# Patient Record
Sex: Female | Born: 1960 | Race: White | Hispanic: No | Marital: Married | Smoking: Current every day smoker
Health system: Southern US, Community
[De-identification: ages and names within clinical notes are randomized; demographics above are authoritative.]

## PROBLEM LIST (undated history)

## (undated) DIAGNOSIS — Z8041 Family history of malignant neoplasm of ovary: Secondary | ICD-10-CM

## (undated) DIAGNOSIS — E538 Deficiency of other specified B group vitamins: Secondary | ICD-10-CM

## (undated) DIAGNOSIS — Z806 Family history of leukemia: Secondary | ICD-10-CM

## (undated) DIAGNOSIS — Z800 Family history of malignant neoplasm of digestive organs: Secondary | ICD-10-CM

## (undated) DIAGNOSIS — Z801 Family history of malignant neoplasm of trachea, bronchus and lung: Secondary | ICD-10-CM

## (undated) DIAGNOSIS — I10 Essential (primary) hypertension: Secondary | ICD-10-CM

## (undated) DIAGNOSIS — G4730 Sleep apnea, unspecified: Secondary | ICD-10-CM

## (undated) DIAGNOSIS — F32A Depression, unspecified: Secondary | ICD-10-CM

## (undated) DIAGNOSIS — Z8051 Family history of malignant neoplasm of kidney: Secondary | ICD-10-CM

## (undated) DIAGNOSIS — E559 Vitamin D deficiency, unspecified: Secondary | ICD-10-CM

## (undated) DIAGNOSIS — F419 Anxiety disorder, unspecified: Secondary | ICD-10-CM

## (undated) DIAGNOSIS — F410 Panic disorder [episodic paroxysmal anxiety] without agoraphobia: Secondary | ICD-10-CM

## (undated) DIAGNOSIS — E785 Hyperlipidemia, unspecified: Secondary | ICD-10-CM

## (undated) DIAGNOSIS — G629 Polyneuropathy, unspecified: Secondary | ICD-10-CM

## (undated) DIAGNOSIS — Z808 Family history of malignant neoplasm of other organs or systems: Secondary | ICD-10-CM

## (undated) DIAGNOSIS — T7840XA Allergy, unspecified, initial encounter: Secondary | ICD-10-CM

## (undated) DIAGNOSIS — F329 Major depressive disorder, single episode, unspecified: Secondary | ICD-10-CM

## (undated) DIAGNOSIS — E119 Type 2 diabetes mellitus without complications: Secondary | ICD-10-CM

## (undated) HISTORY — DX: Essential (primary) hypertension: I10

## (undated) HISTORY — DX: Anxiety disorder, unspecified: F41.9

## (undated) HISTORY — DX: Family history of malignant neoplasm of digestive organs: Z80.0

## (undated) HISTORY — DX: Hyperlipidemia, unspecified: E78.5

## (undated) HISTORY — DX: Sleep apnea, unspecified: G47.30

## (undated) HISTORY — DX: Family history of leukemia: Z80.6

## (undated) HISTORY — DX: Family history of malignant neoplasm of ovary: Z80.41

## (undated) HISTORY — DX: Major depressive disorder, single episode, unspecified: F32.9

## (undated) HISTORY — PX: OTHER SURGICAL HISTORY: SHX169

## (undated) HISTORY — DX: Depression, unspecified: F32.A

## (undated) HISTORY — DX: Type 2 diabetes mellitus without complications: E11.9

## (undated) HISTORY — DX: Family history of malignant neoplasm of kidney: Z80.51

## (undated) HISTORY — PX: TUBAL LIGATION: SHX77

## (undated) HISTORY — DX: Family history of malignant neoplasm of trachea, bronchus and lung: Z80.1

## (undated) HISTORY — DX: Allergy, unspecified, initial encounter: T78.40XA

## (undated) HISTORY — DX: Vitamin D deficiency, unspecified: E55.9

## (undated) HISTORY — DX: Polyneuropathy, unspecified: G62.9

## (undated) HISTORY — PX: ABDOMINAL HYSTERECTOMY: SHX81

## (undated) HISTORY — DX: Panic disorder (episodic paroxysmal anxiety): F41.0

## (undated) HISTORY — DX: Family history of malignant neoplasm of other organs or systems: Z80.8

## (undated) HISTORY — DX: Deficiency of other specified B group vitamins: E53.8

---

## 2011-04-30 ENCOUNTER — Ambulatory Visit: Payer: Self-pay | Admitting: Otolaryngology

## 2011-04-30 LAB — PROTIME-INR: Prothrombin Time: 12.6 secs (ref 11.5–14.7)

## 2011-04-30 LAB — CBC
HCT: 40.7 % (ref 35.0–47.0)
HGB: 14.3 g/dL (ref 12.0–16.0)
MCV: 95 fL (ref 80–100)
Platelet: 381 10*3/uL (ref 150–440)
RBC: 4.28 10*6/uL (ref 3.80–5.20)
WBC: 11.7 10*3/uL — ABNORMAL HIGH (ref 3.6–11.0)

## 2011-04-30 LAB — APTT: Activated PTT: 28.7 secs (ref 23.6–35.9)

## 2011-04-30 LAB — PREGNANCY, URINE: Pregnancy Test, Urine: NEGATIVE m[IU]/mL

## 2011-12-19 ENCOUNTER — Ambulatory Visit: Payer: Self-pay | Admitting: Family Medicine

## 2012-01-02 ENCOUNTER — Ambulatory Visit: Payer: Self-pay | Admitting: Family Medicine

## 2012-08-05 ENCOUNTER — Ambulatory Visit: Payer: Self-pay | Admitting: Nurse Practitioner

## 2014-05-06 DIAGNOSIS — E538 Deficiency of other specified B group vitamins: Secondary | ICD-10-CM | POA: Insufficient documentation

## 2014-07-26 NOTE — Op Note (Signed)
PATIENT NAME:  Chelsea [name], Chelsea [name] MR#:  [id number] DATE OF BIRTH:  1960-04-17  DATE OF PROCEDURE:  04/30/2011  PREOPERATIVE DIAGNOSIS: Severe nasal epistaxis.    POSTOPERATIVE DIAGNOSES: Severe nasal epistaxis.  PROCEDURE: Control of nasal hemorrhage.   SURGEON: Zackery BarefootJ. Madison Howard Patton, MD   DESCRIPTION OF PROCEDURE: The patient was placed in the supine position on the operating room table. After general endotracheal anesthesia had been induced, the patient was turned 90 degrees counterclockwise from anesthesia and placed in a beach chair position, locally anesthetized with infraorbital nerve blocks, greater palatine blocks, and topical and phenylephrine lidocaine soaked Neuro patties. These were removed after the patient had been prepped and draped in the usual fashion. The 0 degree Hopkins rod was used to examine the sinonasal cavity. Essentially there was some bleeding from an excoriation on the left Little's area. With Valsalva maneuver, there was fairly diffuse moderate bleeding in Little's area. Therefore, this was cauterized with the suction cautery under magnification with the 0 degree Hopkins rod. After this, the stomach was suctioned out with an oral gastric tube. There was about 100 mL of blood in the stomach. We did another Valsalva maneuver, no further bleeding. Surgiflo one unit was placed about two-thirds of it in the left, a third of it in the right. The patient was then returned to anesthesia, allowed to emerge from anesthesia in the operating room, and taken to the recovery room in stable condition. There were no complications. Estimated blood loss 10 mL.   ____________________________ J. Gertie BaronMadison Harden Bramer, MD [email or web address] D: 04/30/2011 14:14:23 ET T: 05/01/2011 08:42:53 ET JOB#: [id number]  cc: Zackery BarefootJ. Madison Taegan Haider, MD, <Dictator> Wendee CoppJMADISON Lirio Bach MD ELECTRONICALLY SIGNED 05/17/2011 8:24

## 2014-07-26 NOTE — Consult Note (Signed)
PATIENT NAME:  Chelsea [name], Chelsea [name] MR#:  [id number] DATE OF BIRTH:  10/05/1960  DATE OF CONSULTATION:  04/30/2011  REFERRING PHYSICIAN:   CONSULTING PHYSICIAN:  Zackery BarefootJ. Madison Muhammad Vacca, MD  HISTORY OF PRESENT ILLNESS: The patient is a 54 year old white female who has had a severe nosebleed that started last night. Yesterday afternoon she could not get it controlled. She came to the Emergency Room and has been here for about four to five hours. She has had several attempts at getting the nose bleed to stop, but unfortunately she continues to "poor blood" out of the nose.   DRUG ALLERGIES: Codeine.  MEDICATIONS: Metoprolol.   PAST MEDICAL HISTORY: Hypertension.   PAST SURGICAL HISTORY: Negative   PHYSICAL EXAMINATION:   GENERAL: Obese white female with large pack in the left nostril. It is soaked with blood.   ORAL CAVITY AND OROPHARYNX: There is some blood streaking down the posterior oropharynx.   NECK: Trachea is midline.   IMPRESSION/RECOMMENDATIONS: Acute epistaxis, likely posterior arterial bleed. We will get the patient back to the Operating Room and get the bleeding stopped. If she continues to need       packing, I will do that. Otherwise we will end up most likely sending the patient home later this afternoon so long as everything is taken care of in the Operating Room and she has no further bleeding. ____________________________ Shela CommonsJ. Gertie BaronMadison Sadik Piascik, MD [email or web address] D: 04/30/2011 13:02:36 ET T: 05/01/2011 08:19:12 ET JOB#: [id number]  cc: Zackery BarefootJ. Madison Kelilah Hebard, MD, <Dictator> Glorious PeachSonya Thompson - [facility] Wendee CoppJMADISON Coulson Wehner MD ELECTRONICALLY SIGNED 05/17/2011 8:24

## 2015-03-08 DIAGNOSIS — I10 Essential (primary) hypertension: Secondary | ICD-10-CM | POA: Insufficient documentation

## 2015-03-08 DIAGNOSIS — E782 Mixed hyperlipidemia: Secondary | ICD-10-CM | POA: Insufficient documentation

## 2015-04-23 DIAGNOSIS — G4733 Obstructive sleep apnea (adult) (pediatric): Secondary | ICD-10-CM | POA: Insufficient documentation

## 2015-11-17 ENCOUNTER — Other Ambulatory Visit: Payer: Self-pay | Admitting: Student

## 2015-11-17 DIAGNOSIS — M5416 Radiculopathy, lumbar region: Secondary | ICD-10-CM

## 2015-11-17 DIAGNOSIS — M4726 Other spondylosis with radiculopathy, lumbar region: Secondary | ICD-10-CM

## 2015-11-27 ENCOUNTER — Ambulatory Visit
Admission: RE | Admit: 2015-11-27 | Discharge: 2015-11-27 | Disposition: A | Discharge status: Home / Self Care | Payer: BLUE CROSS/BLUE SHIELD | Source: Ambulatory Visit | Attending: Student | Admitting: Student

## 2015-11-27 DIAGNOSIS — M5126 Other intervertebral disc displacement, lumbar region: Secondary | ICD-10-CM | POA: Diagnosis not present

## 2015-11-27 DIAGNOSIS — M1288 Other specific arthropathies, not elsewhere classified, other specified site: Secondary | ICD-10-CM | POA: Diagnosis not present

## 2015-11-27 DIAGNOSIS — M4726 Other spondylosis with radiculopathy, lumbar region: Secondary | ICD-10-CM | POA: Insufficient documentation

## 2015-11-27 DIAGNOSIS — M5416 Radiculopathy, lumbar region: Secondary | ICD-10-CM

## 2017-02-06 ENCOUNTER — Encounter: Payer: Self-pay | Admitting: Nurse Practitioner

## 2017-02-06 ENCOUNTER — Ambulatory Visit: Payer: BLUE CROSS/BLUE SHIELD | Attending: Nurse Practitioner | Admitting: Nurse Practitioner

## 2017-02-06 DIAGNOSIS — Z789 Other specified health status: Secondary | ICD-10-CM | POA: Diagnosis not present

## 2017-02-06 DIAGNOSIS — N289 Disorder of kidney and ureter, unspecified: Secondary | ICD-10-CM | POA: Insufficient documentation

## 2017-02-06 DIAGNOSIS — F329 Major depressive disorder, single episode, unspecified: Secondary | ICD-10-CM | POA: Insufficient documentation

## 2017-02-06 DIAGNOSIS — G8929 Other chronic pain: Secondary | ICD-10-CM | POA: Diagnosis present

## 2017-02-06 DIAGNOSIS — R200 Anesthesia of skin: Secondary | ICD-10-CM | POA: Diagnosis present

## 2017-02-06 DIAGNOSIS — I129 Hypertensive chronic kidney disease with stage 1 through stage 4 chronic kidney disease, or unspecified chronic kidney disease: Secondary | ICD-10-CM | POA: Insufficient documentation

## 2017-02-06 DIAGNOSIS — Z87442 Personal history of urinary calculi: Secondary | ICD-10-CM | POA: Diagnosis not present

## 2017-02-06 DIAGNOSIS — G629 Polyneuropathy, unspecified: Secondary | ICD-10-CM | POA: Diagnosis not present

## 2017-02-06 DIAGNOSIS — Z7409 Other reduced mobility: Secondary | ICD-10-CM | POA: Diagnosis not present

## 2017-02-06 DIAGNOSIS — M5441 Lumbago with sciatica, right side: Secondary | ICD-10-CM | POA: Diagnosis not present

## 2017-02-06 DIAGNOSIS — M79605 Pain in left leg: Secondary | ICD-10-CM | POA: Diagnosis not present

## 2017-02-06 DIAGNOSIS — M5126 Other intervertebral disc displacement, lumbar region: Secondary | ICD-10-CM | POA: Diagnosis not present

## 2017-02-06 DIAGNOSIS — E559 Vitamin D deficiency, unspecified: Secondary | ICD-10-CM | POA: Insufficient documentation

## 2017-02-06 DIAGNOSIS — M5442 Lumbago with sciatica, left side: Secondary | ICD-10-CM | POA: Diagnosis not present

## 2017-02-06 DIAGNOSIS — E785 Hyperlipidemia, unspecified: Secondary | ICD-10-CM | POA: Diagnosis not present

## 2017-02-06 DIAGNOSIS — M79675 Pain in left toe(s): Secondary | ICD-10-CM

## 2017-02-06 DIAGNOSIS — Z79899 Other long term (current) drug therapy: Secondary | ICD-10-CM | POA: Insufficient documentation

## 2017-02-06 DIAGNOSIS — M79673 Pain in unspecified foot: Secondary | ICD-10-CM | POA: Diagnosis not present

## 2017-02-06 DIAGNOSIS — R636 Underweight: Secondary | ICD-10-CM | POA: Insufficient documentation

## 2017-02-06 DIAGNOSIS — M79604 Pain in right leg: Secondary | ICD-10-CM

## 2017-02-06 DIAGNOSIS — M79674 Pain in right toe(s): Secondary | ICD-10-CM

## 2017-02-06 DIAGNOSIS — M533 Sacrococcygeal disorders, not elsewhere classified: Secondary | ICD-10-CM | POA: Diagnosis not present

## 2017-02-06 DIAGNOSIS — F419 Anxiety disorder, unspecified: Secondary | ICD-10-CM | POA: Diagnosis not present

## 2017-02-06 DIAGNOSIS — N189 Chronic kidney disease, unspecified: Secondary | ICD-10-CM | POA: Diagnosis not present

## 2017-02-06 DIAGNOSIS — G4733 Obstructive sleep apnea (adult) (pediatric): Secondary | ICD-10-CM | POA: Insufficient documentation

## 2017-02-06 NOTE — Progress Notes (Signed)
Patient's Name: Chelsea [name]  MRN: [id number]  Referring Provider: Maryland Pink, MD  DOB: 1961/01/13  PCP: Maryland Pink, MD  DOS: 02/06/2017  Note by: Dionisio David NP  Service setting: Ambulatory outpatient  Specialty: Interventional Pain Management  Location: [facility] (AMB) Pain Management Facility    Patient type: New Patient    Primary Reason(s) for Visit: Initial Patient Evaluation CC: Cold Feet (bilaterally)  HPI  Chelsea [name] is a 56 y.o. year old, female patient, who comes today for an initial evaluation. She has Benign essential hypertension; Hyperlipidemia, mixed; Neuropathy, peripheral, idiopathic; OSA (obstructive sleep apnea); Vitamin B12 deficiency; Chronic pain of lower extremity; Chronic foot pain; Chronic low back pain; Other long term (current) drug therapy; Other specified health status; Other reduced mobility; and Chronic sacroiliac joint pain on their problem list.. Her primarily concern today is the Cold Feet (bilaterally)  Pain Assessment: Location: Left, Right Foot Radiating: up to calves bilaterally-  Onset: More than a month ago Duration: Chronic pain Quality: Discomfort, Sharp, Stabbing, Numbness("myfeet feel colde but on fire", hysensitivity to feet, toes numb) Severity: 3 /10 (self-reported pain score)  Note: Reported level is compatible with observation.                          Effect on ADL: walking long distances,  Timing: Constant Modifying factors: "Nothing"  Onset and Duration: Gradual and Present longer than 3 months Cause of pain: Unknown Severity: Getting worse, NAS-11 at its worse: 7/10, NAS-11 at its best: 3/10, NAS-11 now: 4/10 and NAS-11 on the average: 4/10 Timing: Morning and During activity or exercise Aggravating Factors: Prolonged standing and Walking Alleviating Factors: Resting Associated Problems: Depression, Numbness, Tingling, Pain that wakes patient up and Pain that does not allow patient to sleep Quality of Pain: Aching, Agonizing,  Annoying, Burning, Intermittent, Hot, Sharp, Shooting, Stabbing, Tender and Tingling Previous Examinations or Tests: Nerve conduction test Previous Treatments: The patient denies treatments  The patient comes into the clinics today for the first time for a chronic pain management evaluation. According to the patient her primary area of pain is in her lower extremities; feet. She admits that the right side is greater than the left. She admits that this has been going on for several years. She has numbness, tingling and burning. She states that her feet feel cold however they're on fire. She admits that her feet are very sensitive. She does get occasional sharp, shooting, stabbing pain.  She is status post EMG which she states was negative.   Her next area of pain is in her lower back. She admits that the right side is greater than the left. She denies any precipitating factors. She denies any previous surgery. She has had one epidural steroid injection by Dr. Sharlet Salina 2017. She states this was effective for approximately one week. She has had recent MRI.  Today I took the time to provide the patient with information regarding this pain practice. The patient was informed that the practice is divided into two sections: an interventional pain management section, as well as a completely separate and distinct medication management section. I explained that there are procedure days for interventional therapies, and evaluation days for follow-ups and medication management. Because of the amount of documentation required during both, they are kept separated. This means that there is the possibility that she [name] be scheduled for a procedure on one day, and medication management the next. I have also informed her that because  of staffing and facility limitations, this practice will no longer take patients for medication management only. To illustrate the reasons for this, I gave the patient the example of surgeons, and  how inappropriate it would be to refer a patient to his/her care, just to write for the post-surgical antibiotics on a surgery done by a different surgeon.   Because interventional pain management is part of the board-certified specialty for the doctors, the patient was informed that joining this practice means that they are open to any and all interventional therapies. I made it clear that this does not mean that they will be forced to have any procedures done. What this means is that I believe interventional therapies to be essential part of the diagnosis and proper management of chronic pain conditions. Therefore, patients not interested in these interventional alternatives will be better served under the care of a different practitioner.  The patient was also made aware of my Comprehensive Pain Management Safety Guidelines where by joining this practice, they limit all of their nerve blocks and joint injections to those done by our practice, for as long as we are retained to manage their care. Historic Controlled Substance Pharmacotherapy Review  PMP and historical list of controlled substances: Diazepam 5 mg, lorazepam 0.5 mg, hydrocodone/acetaminophen 5/325 mg, Highest opioid analgesic regimen found: Hydrocodone/acetaminophen 5/325 mg 2 tablets 5 times per day (fill date 04/13/2015) hydrocodone 50 mg per day Most recent opioid analgesic: None Current opioid analgesics: None Highest recorded MME/day: 50 mg/day MME/day: 0 mg/day Medications: The patient did not bring the medication(s) to the appointment, as requested in our "New Patient Package" Pharmacodynamics: Desired effects: Analgesia: The patient reports >50% benefit. Reported improvement in function: The patient reports medication allows her to accomplish basic ADLs. Clinically meaningful improvement in function (CMIF): Sustained CMIF goals met Perceived effectiveness: Described as relatively effective, allowing for increase in  activities of daily living (ADL) Undesirable effects: Side-effects or Adverse reactions: None reported Historical Monitoring: The patient  reports that she does not use drugs. List of all UDS Test(s): No results found for: MDMA, COCAINSCRNUR, PCPSCRNUR, PCPQUANT, CANNABQUANT, THCU, [place] List of all Serum Drug Screening Test(s):  No results found for: AMPHSCRSER, BARBSCRSER, BENZOSCRSER, COCAINSCRSER, PCPSCRSER, PCPQUANT, THCSCRSER, CANNABQUANT, OPIATESCRSER, OXYSCRSER, PROPOXSCRSER Historical Background Evaluation: [place] PDMP: Six (6) year initial data search conducted.             [place] Department of public safety, offender search: Editor, commissioning Information) Non-contributory Risk Assessment Profile: Aberrant behavior: None observed or detected today Risk factors for fatal opioid overdose: age 69-45 years old, caucasian and sleep apnea Fatal overdose hazard ratio (HR): Calculation deferred Non-fatal overdose hazard ratio (HR): Calculation deferred Risk of opioid abuse or dependence: 0.7-3.0% with doses ? 36 MME/day and 6.1-26% with doses ? 120 MME/day. Substance use disorder (SUD) risk level: Pending results of Medical Psychology Evaluation for SUD Opioid risk tool (ORT) (Total Score): 3  ORT Scoring interpretation table:  Score <3 = Low Risk for SUD  Score between 4-7 = Moderate Risk for SUD  Score >8 = High Risk for Opioid Abuse   PHQ-2 Depression Scale:  Total score: 0  PHQ-2 Scoring interpretation table: (Score and probability of major depressive disorder)  Score 0 = No depression  Score 1 = 15.4% Probability  Score 2 = 21.1% Probability  Score 3 = 38.4% Probability  Score 4 = 45.5% Probability  Score 5 = 56.4% Probability  Score 6 = 78.6% Probability   PHQ-9 Depression Scale:  Total  score: 0  PHQ-9 Scoring interpretation table:  Score 0-4 = No depression  Score 5-9 = Mild depression  Score 10-14 = Moderate depression  Score 15-19 = Moderately severe depression  Score 20-27 =  Severe depression (2.4 times higher risk of SUD and 2.89 times higher risk of overuse)   Pharmacologic Plan: Pending ordered tests and/or consults  Meds  The patient has a current medication list which includes the following prescription(s): acetaminophen, amitriptyline, amitriptyline, citalopram, citalopram, furosemide, furosemide, gabapentin, gabapentin, ibuprofen, lorazepam, losartan, naproxen sodium, omega-3 fatty acids, pregabalin, and vitamin b-12.  Current Outpatient Medications on File Prior to Visit  Medication Sig  . acetaminophen (TYLENOL) 500 MG tablet Take by mouth.  Marland Kitchen amitriptyline (ELAVIL) 10 MG tablet TAKE 1 TO 2 TABLETS BY MOUTH AT BEDTIME  . amitriptyline (ELAVIL) 10 MG tablet   . citalopram (CELEXA) 20 MG tablet Take by mouth.  . citalopram (CELEXA) 20 MG tablet Take 20 mg daily by mouth.  . furosemide (LASIX) 20 MG tablet Take by mouth.  . furosemide (LASIX) 20 MG tablet   . gabapentin (NEURONTIN) 100 MG capsule TAKE 1 CAPSULE BY MOUTH 3 TIMES A DAY . CAN TITRATE UP TO 3 CAPSULES ('300MG'$ ) 3 TIMES A DAY .  . gabapentin (NEURONTIN) 100 MG capsule   . ibuprofen (ADVIL,MOTRIN) 200 MG tablet Take by mouth.  Marland Kitchen LORazepam (ATIVAN) 0.5 MG tablet Take by mouth.  . losartan (COZAAR) 100 MG tablet Take 100 mg daily by mouth.  . naproxen sodium (ALEVE) 220 MG tablet Take by mouth.  . Omega-3 Fatty Acids (FISH OIL PO) Take by mouth.  . pregabalin (LYRICA) 50 MG capsule Take by mouth.  . vitamin B-12 (CYANOCOBALAMIN) 1000 MCG tablet Take by mouth.   No current facility-administered medications on file prior to visit.    Imaging Review   Lumbosacral Imaging: Lumbar MR wo contrast:  Results for orders placed during the hospital encounter of 11/27/15  MR Lumbar Spine Wo Contrast   Narrative CLINICAL DATA:  No known injury. Low back pain. Bilateral leg pain.  EXAM: MRI LUMBAR SPINE WITHOUT CONTRAST  TECHNIQUE: Multiplanar, multisequence MR imaging of the lumbar spine  was performed. No intravenous contrast was administered.  COMPARISON:  None.  FINDINGS: Segmentation:  Standard.  Alignment:  Physiologic.  Vertebrae:  No fracture, evidence of discitis, or bone lesion.  Conus medullaris: Extends to the T12-L1 level and appears normal.  Paraspinal and other soft tissues: No paraspinal abnormality.  Disc levels:  Disc spaces: Disc spaces are maintained.  T12-L1: No significant disc bulge. No evidence of neural foraminal stenosis. No central canal stenosis.  L1-L2: No significant disc bulge. No evidence of neural foraminal stenosis. No central canal stenosis.  L2-L3: No significant disc bulge. No evidence of neural foraminal stenosis. No central canal stenosis.  L3-L4: No significant disc bulge. No evidence of neural foraminal stenosis. No central canal stenosis. Mild left facet arthropathy.  L4-L5: Shallow right foraminal disc protrusion. No central canal stenosis. Moderate bilateral facet arthropathy. Mild right foraminal narrowing. No left foraminal narrowing.  L5-S1: No significant disc protrusion. No foraminal stenosis. Moderate right and mild left facet arthropathy. No central canal stenosis.  IMPRESSION: 1. At L4-5 there is a shallow right foraminal disc protrusion. Moderate bilateral facet arthropathy. Mild right foraminal narrowing. 2. At L5-S1 there is moderate right and mild left facet arthropathy.   Electronically Signed   By: Kathreen Devoid   On: 11/27/2015 13:43    Note: Available results from prior imaging  studies were reviewed.        ROS  Cardiovascular History: High blood pressure Pulmonary or Respiratory History: No reported pulmonary signs or symptoms such as wheezing and difficulty taking a deep full breath (Asthma), difficulty blowing air out (Emphysema), coughing up mucus (Bronchitis), persistent dry cough, or temporary stoppage of breathing during sleep Neurological History: Abnormal skin sensations  (Peripheral Neuropathy) Review of Past Neurological Studies: No results found for this or any previous visit. Psychological-Psychiatric History: Anxiousness and Depressed Gastrointestinal History: No reported gastrointestinal signs or symptoms such as vomiting or evacuating blood, reflux, heartburn, alternating episodes of diarrhea and constipation, inflamed or scarred liver, or pancreas or irrregular and/or infrequent bowel movements Genitourinary History: No reported renal or genitourinary signs or symptoms such as difficulty voiding or producing urine, peeing blood, non-functioning kidney, kidney stones, difficulty emptying the bladder, difficulty controlling the flow of urine, or chronic kidney disease Hematological History: No reported hematological signs or symptoms such as prolonged bleeding, low or poor functioning platelets, bruising or bleeding easily, hereditary bleeding problems, low energy levels due to low hemoglobin or being anemic Endocrine History: No reported endocrine signs or symptoms such as high or low blood sugar, rapid heart rate due to high thyroid levels, obesity or weight gain due to slow thyroid or thyroid disease Rheumatologic History: No reported rheumatological signs and symptoms such as fatigue, joint pain, tenderness, swelling, redness, heat, stiffness, decreased range of motion, with or without associated rash Musculoskeletal History: Negative for myasthenia gravis, muscular dystrophy, multiple sclerosis or malignant hyperthermia Work History: Working full time  Allergies  Chelsea [name] is allergic to codeine.  Laboratory Chemistry  Inflammation Markers No results found for: CRP, ESRSEDRATE (CRP: Acute Phase) (ESR: Chronic Phase) Renal Function Markers No results found for: BUN, CREATININE, GFRAA, GFRNONAA Hepatic Function Markers No results found for: AST, ALT, ALBUMIN, ALKPHOS, HCVAB Electrolytes No results found for: NA, K, CL, CALCIUM, MG Neuropathy Markers No  results found for: ATFTDDUK02 Bone Pathology Markers No results found for: Hendricks Milo, VD125OH2TOT, [id number], [id number], 25OHVITD1, 25OHVITD2, 25OHVITD3, CALCIUM, TESTOFREE, TESTOSTERONE Coagulation Parameters Lab Results  Component Value Date   INR 0.9 04/30/2011   LABPROT 12.6 04/30/2011   APTT 28.7 04/30/2011   PLT 381 04/30/2011   Cardiovascular Markers Lab Results  Component Value Date   HGB 14.3 04/30/2011   HCT 40.7 04/30/2011   Note: Lab results reviewed.  PFSH  Drug: Chelsea [name]  reports that she does not use drugs. Alcohol:  reports that she drinks alcohol. Tobacco:  reports that she has been smoking cigarettes.  She has a 39.00 pack-year smoking history. She does not have any smokeless tobacco history on file. Medical:  has a past medical history of Allergy, Anxiety, Depression, Hyperlipidemia, Hypertension, Sleep apnea, Vitamin B12 deficiency, and Vitamin D deficiency. Family: family history includes Cancer in her father.  Past Surgical History:  Procedure Laterality Date  . ABDOMINAL HYSTERECTOMY    . nose    . TUBAL LIGATION     Active Ambulatory Problems    Diagnosis Date Noted  . Benign essential hypertension 03/08/2015  . Hyperlipidemia, mixed 03/08/2015  . Neuropathy, peripheral, idiopathic 05/06/2016  . OSA (obstructive sleep apnea) 04/23/2015  . Vitamin B12 deficiency 05/06/2014  . Chronic pain of lower extremity 02/06/2017  . Chronic foot pain 02/06/2017  . Chronic low back pain 02/06/2017  . Other long term (current) drug therapy 02/06/2017  . Other specified health status 02/06/2017  . Other reduced mobility 02/06/2017  . Chronic sacroiliac joint  pain 02/06/2017   Resolved Ambulatory Problems    Diagnosis Date Noted  . No Resolved Ambulatory Problems   Past Medical History:  Diagnosis Date  . Allergy   . Anxiety   . Depression   . Hyperlipidemia   . Hypertension   . Sleep apnea   . Vitamin B12 deficiency   . Vitamin D deficiency     Constitutional Exam  General appearance: Well nourished, well developed, and well hydrated. In no apparent acute distress Vitals:   02/06/17 0812  BP: (!) 180/107  Pulse: 94  Resp: 16  Temp: 98.1 F (36.7 C)  SpO2: 100%  Weight: 205 lb (93 kg)  Height: '5\' 3"'$  (1.6 m)   BMI Assessment: Estimated body mass index is 36.31 kg/m as calculated from the following:   Height as of this encounter: '5\' 3"'$  (1.6 m).   Weight as of this encounter: 205 lb (93 kg).  BMI interpretation table: BMI level Category Range association with higher incidence of chronic pain  <18 kg/m2 Underweight   18.5-24.9 kg/m2 Ideal body weight   25-29.9 kg/m2 Overweight Increased incidence by 20%  30-34.9 kg/m2 Obese (Class I) Increased incidence by 68%  35-39.9 kg/m2 Severe obesity (Class II) Increased incidence by 136%  >40 kg/m2 Extreme obesity (Class III) Increased incidence by 254%   BMI Readings from Last 4 Encounters:  02/06/17 36.31 kg/m   Wt Readings from Last 4 Encounters:  02/06/17 205 lb (93 kg)  Psych/Mental status: Alert, oriented x 3 (person, place, & time)       Eyes: PERLA Respiratory: No evidence of acute respiratory distress  Cervical Spine Exam  Inspection: No masses, redness, or swelling Alignment: Symmetrical Functional ROM: Unrestricted ROM      Stability: No instability detected Muscle strength & Tone: Functionally intact Sensory: Unimpaired Palpation: No palpable anomalies              Upper Extremity (UE) Exam    Side: Right upper extremity  Side: Left upper extremity  Inspection: No masses, redness, swelling, or asymmetry. No contractures  Inspection: No masses, redness, swelling, or asymmetry. No contractures  Functional ROM: Unrestricted ROM          Functional ROM: Unrestricted ROM          Muscle strength & Tone: Functionally intact  Muscle strength & Tone: Functionally intact  Sensory: Unimpaired  Sensory: Unimpaired  Palpation: No palpable anomalies               Palpation: No palpable anomalies              Specialized Test(s): Deferred         Specialized Test(s): Deferred          Thoracic Spine Exam  Inspection: No masses, redness, or swelling Alignment: Symmetrical Functional ROM: Unrestricted ROM Stability: No instability detected Sensory: Unimpaired Muscle strength & Tone: No palpable anomalies  Lumbar Spine Exam  Inspection: No masses, redness, or swelling Alignment: Symmetrical Functional ROM: Pain restricted ROM      Stability: No instability detected Muscle strength & Tone: Functionally intact Sensory: Unimpaired Palpation: Complains of area being tender to palpation       Provocative Tests: Lumbar Hyperextension and rotation test: Positive bilaterally for facet joint pain. Patrick's Maneuver: Positive for left-sided S-I arthralgia              Gait & Posture Assessment  Ambulation: Unassisted Gait: Antalgic Posture: WNL   Lower Extremity Exam    Side:  Right lower extremity  Side: Left lower extremity  Inspection: Some redness observed, increased temperature 2+ pulses  Inspection: Some redness observed, increased temperature 2+ pulses  Functional ROM: Unrestricted ROM          Functional ROM: Unrestricted ROM          Muscle strength & Tone: Functionally intact  Muscle strength & Tone: Functionally intact  Sensory: Unimpaired  Sensory: Unimpaired  Palpation: Tender  Palpation: Tender   Assessment  Primary Diagnosis & Pertinent Problem List: Diagnoses of Chronic foot pain, unspecified laterality, Chronic pain of both lower extremities, Chronic bilateral low back pain with bilateral sciatica, Chronic sacroiliac joint pain, Other long term (current) drug therapy, Other specified health status, and Other reduced mobility were pertinent to this visit.  Visit Diagnosis: 1. Chronic foot pain, unspecified laterality   2. Chronic pain of both lower extremities   3. Chronic bilateral low back pain with bilateral sciatica   4.  Chronic sacroiliac joint pain   5. Other long term (current) drug therapy   6. Other specified health status   7. Other reduced mobility    Plan of Care  Initial treatment plan:  Please be advised that as per protocol, today's visit has been an evaluation only. We have not taken over the patient's controlled substance management.  Problem-specific plan: No problem-specific Assessment & Plan notes found for this encounter.  Ordered Lab-work, Procedure(s), Referral(s), & Consult(s): Orders Placed This Encounter  Procedures  . DG Si Joints  . Compliance Drug Analysis, Ur  . Comp. Metabolic Panel (12)  . C-reactive protein  . Sedimentation rate  . Magnesium  . 25-Hydroxyvitamin D Lcms D2+D3  . Vitamin B12   Pharmacotherapy: Medications ordered:  No orders of the defined types were placed in this encounter.  Medications administered during this visit: Chelsea [name] had no medications administered during this visit.   Pharmacotherapy under consideration:  Opioid Analgesics: The patient was informed that there is no guarantee that she would be a candidate for opioid analgesics. The decision will be made following CDC guidelines. This decision will be based on the results of diagnostic studies, as well as Chelsea [name]'s risk profile.  Membrane stabilizer: To be determined at a later time Muscle relaxant: To be determined at a later time NSAID: To be determined at a later time Other analgesic(s): To be determined at a later time   Interventional therapies under consideration: Chelsea [name] was informed that there is no guarantee that she would be a candidate for interventional therapies. The decision will be based on the results of diagnostic studies, as well as Chelsea [name]'s risk profile.  Possible procedure(s): Lidocaine infusion Possible spinal cord stimulator Diagnostic bilateral LESI Diagnostic bilateral lumbar facet nerve block Possible bilateral lumbar facet RFA    Provider-requested  follow-up: Return for w/ Dr. Dossie Arbour, 2nd Visit.  Future Appointments  Date Time Provider M[place]  02/19/2017  8:30 AM Milinda Pointer, MD [facility] None    Primary Care Physician: Maryland Pink, MD Location: [facility] Outpatient Pain Management Facility Note by:  Date: 02/06/2017; Time: 10:20 AM  Pain Score Disclaimer: We use the NRS-11 scale. This is a self-reported, subjective measurement of pain severity with only modest accuracy. It is used primarily to identify changes within a particular patient. It must be understood that outpatient pain scales are significantly less accurate that those used for research, where they can be applied under ideal controlled circumstances with minimal exposure to variables. In reality, the score is  likely to be a combination of pain intensity and pain affect, where pain affect describes the degree of emotional arousal or changes in action readiness caused by the sensory experience of pain. Factors such as social and work situation, setting, emotional state, anxiety levels, expectation, and prior pain experience [place] influence pain perception and show large inter-individual differences that [place] also be affected by time variables.  Patient instructions provided during this appointment: Patient Instructions    ____________________________________________________________________________________________  Appointment Policy Summary  It is our goal and responsibility to provide the medical community with assistance in the evaluation and management of patients with chronic pain. Unfortunately our resources are limited. Because we do not have an unlimited amount of time, or available appointments, we are required to closely monitor and manage their use. The following rules exist to maximize their use:  Patient's responsibilities: 1. Punctuality:  At what time should I arrive? You should be physically present in our office 30 minutes before your scheduled  appointment. Your scheduled appointment is with your assigned healthcare provider. However, it takes 5-10 minutes to be "checked-in", and another 15 minutes for the nurses to do the admission. If you arrive to our office at the time you were given for your appointment, you will end up being at least 20-25 minutes late to your appointment with the provider. 2. Tardiness:  What happens if I arrive only a few minutes after my scheduled appointment time? You will need to reschedule your appointment. The cutoff is your appointment time. This is why it is so important that you arrive at least 30 minutes before that appointment. If you have an appointment scheduled for 10:00 AM and you arrive at 10:01, you will be required to reschedule your appointment.  3. Plan ahead:  Always assume that you will encounter traffic on your way in. Plan for it. If you are dependent on a driver, make sure they understand these rules and the need to arrive early. 4. Other appointments and responsibilities:  Avoid scheduling any other appointments before or after your pain clinic appointments.  5. Be prepared:  Write down everything that you need to discuss with your healthcare provider and give this information to the admitting nurse. Write down the medications that you will need refilled. Bring your pills and bottles (even the empty ones), to all of your appointments, except for those where a procedure is scheduled. 6. No children or pets:  Find someone to take care of them. It is not appropriate to bring them in. 7. Scheduling changes:  We request "advanced notification" of any changes or cancellations. 8. Advanced notification:  Defined as a time period of more than 24 hours prior to the originally scheduled appointment. This allows for the appointment to be offered to other patients. 9. Rescheduling:  When a visit is rescheduled, it will require the cancellation of the original appointment. For this reason they both fall  within the category of "Cancellations".  10. Cancellations:  They require advanced notification. Any cancellation less than 24 hours before the  appointment will be recorded as a "No Show". 11. No Show:  Defined as an unkept appointment where the patient failed to notify or declare to the practice their intention or inability to keep the appointment.  Corrective process for repeat offenders:  1. Tardiness: Three (3) episodes of rescheduling due to late arrivals will be recorded as one (1) "No Show". 2. Cancellation or reschedule: Three (3) cancellations or rescheduling will be recorded as one (1) "No Show".  3. "No Shows": Three (3) "No Shows" within a 12 month period will result in discharge from the practice.  ____________________________________________________________________________________________  ____________________________________________________________________________________________  Pain Scale  Introduction: The pain score used by this practice is the Verbal Numerical Rating Scale (VNRS-11). This is an 11-point scale. It is for adults and children 10 years or older. There are significant differences in how the pain score is reported, used, and applied. Forget everything you learned in the past and learn this scoring system.  General Information: The scale should reflect your current level of pain. Unless you are specifically asked for the level of your worst pain, or your average pain. If you are asked for one of these two, then it should be understood that it is over the past 24 hours.  Basic Activities of Daily Living (ADL): Personal hygiene, dressing, eating, transferring, and using restroom.  Instructions: Most patients tend to report their level of pain as a combination of two factors, their physical pain and their psychosocial pain. This last one is also known as "suffering" and it is reflection of how physical pain affects you socially and psychologically. From now on,  report them separately. From this point on, when asked to report your pain level, report only your physical pain. Use the following table for reference.  Pain Clinic Pain Levels (0-5/10)  Pain Level Score  Description  No Pain 0   Mild pain 1 Nagging, annoying, but does not interfere with basic activities of daily living (ADL). Patients are able to eat, bathe, get dressed, toileting (being able to get on and off the toilet and perform personal hygiene functions), transfer (move in and out of bed or a chair without assistance), and maintain continence (able to control bladder and bowel functions). Blood pressure and heart rate are unaffected. A normal heart rate for a healthy adult ranges from 60 to 100 bpm (beats per minute).   Mild to moderate pain 2 Noticeable and distracting. Impossible to hide from other people. More frequent flare-ups. Still possible to adapt and function close to normal. It can be very annoying and [place] have occasional stronger flare-ups. With discipline, patients [place] get used to it and adapt.   Moderate pain 3 Interferes significantly with activities of daily living (ADL). It becomes difficult to feed, bathe, get dressed, get on and off the toilet or to perform personal hygiene functions. Difficult to get in and out of bed or a chair without assistance. Very distracting. With effort, it can be ignored when deeply involved in activities.   Moderately severe pain 4 Impossible to ignore for more than a few minutes. With effort, patients [place] still be able to manage work or participate in some social activities. Very difficult to concentrate. Signs of autonomic nervous system discharge are evident: dilated pupils (mydriasis); mild sweating (diaphoresis); sleep interference. Heart rate becomes elevated (>115 bpm). Diastolic blood pressure (lower number) rises above 100 mmHg. Patients find relief in laying down and not moving.   Severe pain 5 Intense and extremely unpleasant. Associated  with frowning face and frequent crying. Pain overwhelms the senses.  Ability to do any activity or maintain social relationships becomes significantly limited. Conversation becomes difficult. Pacing back and forth is common, as getting into a comfortable position is nearly impossible. Pain wakes you up from deep sleep. Physical signs will be obvious: pupillary dilation; increased sweating; goosebumps; brisk reflexes; cold, clammy hands and feet; nausea, vomiting or dry heaves; loss of appetite; significant sleep disturbance with inability to fall asleep  or to remain asleep. When persistent, significant weight loss is observed due to the complete loss of appetite and sleep deprivation.  Blood pressure and heart rate becomes significantly elevated. Caution: If elevated blood pressure triggers a pounding headache associated with blurred vision, then the patient should immediately seek attention at an urgent or emergency care unit, as these [place] be signs of an impending stroke.    Emergency Department Pain Levels (6-10/10)  Emergency Room Pain 6 Severely limiting. Requires emergency care and should not be seen or managed at an outpatient pain management facility. Communication becomes difficult and requires great effort. Assistance to reach the emergency department [place] be required. Facial flushing and profuse sweating along with potentially dangerous increases in heart rate and blood pressure will be evident.   Distressing pain 7 Self-care is very difficult. Assistance is required to transport, or use restroom. Assistance to reach the emergency department will be required. Tasks requiring coordination, such as bathing and getting dressed become very difficult.   Disabling pain 8 Self-care is no longer possible. At this level, pain is disabling. The individual is unable to do even the most "basic" activities such as walking, eating, bathing, dressing, transferring to a bed, or toileting. Fine motor skills are  lost. It is difficult to think clearly.   Incapacitating pain 9 Pain becomes incapacitating. Thought processing is no longer possible. Difficult to remember your own name. Control of movement and coordination are lost.   The worst pain imaginable 10 At this level, most patients pass out from pain. When this level is reached, collapse of the autonomic nervous system occurs, leading to a sudden drop in blood pressure and heart rate. This in turn results in a temporary and dramatic drop in blood flow to the brain, leading to a loss of consciousness. Fainting is one of the body's self defense mechanisms. Passing out puts the brain in a calmed state and causes it to shut down for a while, in order to begin the healing process.    Summary: 1. Refer to this scale when providing [place] with your pain level. 2. Be accurate and careful when reporting your pain level. This will help with your care. 3. Over-reporting your pain level will lead to loss of credibility. 4. Even a level of 1/10 means that there is pain and will be treated at our facility. 5. High, inaccurate reporting will be documented as "Symptom Exaggeration", leading to loss of credibility and suspicions of possible secondary gains such as obtaining more narcotics, or wanting to appear disabled, for fraudulent reasons. 6. Only pain levels of 5 or below will be seen at our facility. 7. Pain levels of 6 and above will be sent to the Emergency Department and the appointment cancelled. ____________________________________________________________________________________________   BMI Assessment: Estimated body mass index is 36.31 kg/m as calculated from the following:   Height as of this encounter: '5\' 3"'$  (1.6 m).   Weight as of this encounter: 205 lb (93 kg).  BMI interpretation table: BMI level Category Range association with higher incidence of chronic pain  <18 kg/m2 Underweight   18.5-24.9 kg/m2 Ideal body weight   25-29.9 kg/m2 Overweight  Increased incidence by 20%  30-34.9 kg/m2 Obese (Class I) Increased incidence by 68%  35-39.9 kg/m2 Severe obesity (Class II) Increased incidence by 136%  >40 kg/m2 Extreme obesity (Class III) Increased incidence by 254%   BMI Readings from Last 4 Encounters:  02/06/17 36.31 kg/m   Wt Readings from Last 4 Encounters:  02/06/17 205  lb (93 kg)

## 2017-02-06 NOTE — Patient Instructions (Addendum)
____________________________________________________________________________________________  Appointment Policy Summary  It is our goal and responsibility to provide the medical community with assistance in the evaluation and management of patients with chronic pain. Unfortunately our resources are limited. Because we do not have an unlimited amount of time, or available appointments, we are required to closely monitor and manage their use. The following rules exist to maximize their use:  Patient's responsibilities: 1. Punctuality:  At what time should I arrive? You should be physically present in our office 30 minutes before your scheduled appointment. Your scheduled appointment is with your assigned healthcare provider. However, it takes 5-10 minutes to be "checked-in", and another 15 minutes for the nurses to do the admission. If you arrive to our office at the time you were given for your appointment, you will end up being at least 20-25 minutes late to your appointment with the provider. 2. Tardiness:  What happens if I arrive only a few minutes after my scheduled appointment time? You will need to reschedule your appointment. The cutoff is your appointment time. This is why it is so important that you arrive at least 30 minutes before that appointment. If you have an appointment scheduled for 10:00 AM and you arrive at 10:01, you will be required to reschedule your appointment.  3. Plan ahead:  Always assume that you will encounter traffic on your way in. Plan for it. If you are dependent on a driver, make sure they understand these rules and the need to arrive early. 4. Other appointments and responsibilities:  Avoid scheduling any other appointments before or after your pain clinic appointments.  5. Be prepared:  Write down everything that you need to discuss with your healthcare provider and give this information to the admitting nurse. Write down the medications that you will need  refilled. Bring your pills and bottles (even the empty ones), to all of your appointments, except for those where a procedure is scheduled. 6. No children or pets:  Find someone to take care of them. It is not appropriate to bring them in. 7. Scheduling changes:  We request "advanced notification" of any changes or cancellations. 8. Advanced notification:  Defined as a time period of more than 24 hours prior to the originally scheduled appointment. This allows for the appointment to be offered to other patients. 9. Rescheduling:  When a visit is rescheduled, it will require the cancellation of the original appointment. For this reason they both fall within the category of "Cancellations".  10. Cancellations:  They require advanced notification. Any cancellation less than 24 hours before the  appointment will be recorded as a "No Show". 11. No Show:  Defined as an unkept appointment where the patient failed to notify or declare to the practice their intention or inability to keep the appointment.  Corrective process for repeat offenders:  1. Tardiness: Three (3) episodes of rescheduling due to late arrivals will be recorded as one (1) "No Show". 2. Cancellation or reschedule: Three (3) cancellations or rescheduling will be recorded as one (1) "No Show". 3. "No Shows": Three (3) "No Shows" within a 12 month period will result in discharge from the practice.  ____________________________________________________________________________________________  ____________________________________________________________________________________________  Pain Scale  Introduction: The pain score used by this practice is the Verbal Numerical Rating Scale (VNRS-11). This is an 11-point scale. It is for adults and children 10 years or older. There are significant differences in how the pain score is reported, used, and applied. Forget everything you learned in the past and  learn this scoring  system.  General Information: The scale should reflect your current level of pain. Unless you are specifically asked for the level of your worst pain, or your average pain. If you are asked for one of these two, then it should be understood that it is over the past 24 hours.  Basic Activities of Daily Living (ADL): Personal hygiene, dressing, eating, transferring, and using restroom.  Instructions: Most patients tend to report their level of pain as a combination of two factors, their physical pain and their psychosocial pain. This last one is also known as "suffering" and it is reflection of how physical pain affects you socially and psychologically. From now on, report them separately. From this point on, when asked to report your pain level, report only your physical pain. Use the following table for reference.  Pain Clinic Pain Levels (0-5/10)  Pain Level Score  Description  No Pain 0   Mild pain 1 Nagging, annoying, but does not interfere with basic activities of daily living (ADL). Patients are able to eat, bathe, get dressed, toileting (being able to get on and off the toilet and perform personal hygiene functions), transfer (move in and out of bed or a chair without assistance), and maintain continence (able to control bladder and bowel functions). Blood pressure and heart rate are unaffected. A normal heart rate for a healthy adult ranges from 60 to 100 bpm (beats per minute).   Mild to moderate pain 2 Noticeable and distracting. Impossible to hide from other people. More frequent flare-ups. Still possible to adapt and function close to normal. It can be very annoying and [place] have occasional stronger flare-ups. With discipline, patients [place] get used to it and adapt.   Moderate pain 3 Interferes significantly with activities of daily living (ADL). It becomes difficult to feed, bathe, get dressed, get on and off the toilet or to perform personal hygiene functions. Difficult to get in and out of  bed or a chair without assistance. Very distracting. With effort, it can be ignored when deeply involved in activities.   Moderately severe pain 4 Impossible to ignore for more than a few minutes. With effort, patients [place] still be able to manage work or participate in some social activities. Very difficult to concentrate. Signs of autonomic nervous system discharge are evident: dilated pupils (mydriasis); mild sweating (diaphoresis); sleep interference. Heart rate becomes elevated (>115 bpm). Diastolic blood pressure (lower number) rises above 100 mmHg. Patients find relief in laying down and not moving.   Severe pain 5 Intense and extremely unpleasant. Associated with frowning face and frequent crying. Pain overwhelms the senses.  Ability to do any activity or maintain social relationships becomes significantly limited. Conversation becomes difficult. Pacing back and forth is common, as getting into a comfortable position is nearly impossible. Pain wakes you up from deep sleep. Physical signs will be obvious: pupillary dilation; increased sweating; goosebumps; brisk reflexes; cold, clammy hands and feet; nausea, vomiting or dry heaves; loss of appetite; significant sleep disturbance with inability to fall asleep or to remain asleep. When persistent, significant weight loss is observed due to the complete loss of appetite and sleep deprivation.  Blood pressure and heart rate becomes significantly elevated. Caution: If elevated blood pressure triggers a pounding headache associated with blurred vision, then the patient should immediately seek attention at an urgent or emergency care unit, as these [place] be signs of an impending stroke.    Emergency Department Pain Levels (6-10/10)  Emergency Room Pain 6   Severely limiting. Requires emergency care and should not be seen or managed at an outpatient pain management facility. Communication becomes difficult and requires great effort. Assistance to reach the  emergency department [place] be required. Facial flushing and profuse sweating along with potentially dangerous increases in heart rate and blood pressure will be evident.   Distressing pain 7 Self-care is very difficult. Assistance is required to transport, or use restroom. Assistance to reach the emergency department will be required. Tasks requiring coordination, such as bathing and getting dressed become very difficult.   Disabling pain 8 Self-care is no longer possible. At this level, pain is disabling. The individual is unable to do even the most "basic" activities such as walking, eating, bathing, dressing, transferring to a bed, or toileting. Fine motor skills are lost. It is difficult to think clearly.   Incapacitating pain 9 Pain becomes incapacitating. Thought processing is no longer possible. Difficult to remember your own name. Control of movement and coordination are lost.   The worst pain imaginable 10 At this level, most patients pass out from pain. When this level is reached, collapse of the autonomic nervous system occurs, leading to a sudden drop in blood pressure and heart rate. This in turn results in a temporary and dramatic drop in blood flow to the brain, leading to a loss of consciousness. Fainting is one of the body's self defense mechanisms. Passing out puts the brain in a calmed state and causes it to shut down for a while, in order to begin the healing process.    Summary: 1. Refer to this scale when providing us with your pain level. 2. Be accurate and careful when reporting your pain level. This will help with your care. 3. Over-reporting your pain level will lead to loss of credibility. 4. Even a level of 1/10 means that there is pain and will be treated at our facility. 5. High, inaccurate reporting will be documented as "Symptom Exaggeration", leading to loss of credibility and suspicions of possible secondary gains such as obtaining more narcotics, or wanting to appear  disabled, for fraudulent reasons. 6. Only pain levels of 5 or below will be seen at our facility. 7. Pain levels of 6 and above will be sent to the Emergency Department and the appointment cancelled. ____________________________________________________________________________________________   BMI Assessment: Estimated body mass index is 36.31 kg/m as calculated from the following:   Height as of this encounter: 5\' 3"  (1.6 m).   Weight as of this encounter: 205 lb (93 kg).  BMI interpretation table: BMI level Category Range association with higher incidence of chronic pain  <18 kg/m2 Underweight   18.5-24.9 kg/m2 Ideal body weight   25-29.9 kg/m2 Overweight Increased incidence by 20%  30-34.9 kg/m2 Obese (Class I) Increased incidence by 68%  35-39.9 kg/m2 Severe obesity (Class II) Increased incidence by 136%  >40 kg/m2 Extreme obesity (Class III) Increased incidence by 254%   BMI Readings from Last 4 Encounters:  02/06/17 36.31 kg/m   Wt Readings from Last 4 Encounters:  02/06/17 205 lb (93 kg)

## 2017-02-06 NOTE — Progress Notes (Signed)
Safety precautions to be maintained throughout the outpatient stay will include: orient to surroundings, keep bed in low position, maintain call bell within reach at all times, provide assistance with transfer out of bed and ambulation.  

## 2017-02-11 LAB — COMPLIANCE DRUG ANALYSIS, UR

## 2017-02-17 LAB — C-REACTIVE PROTEIN: CRP: 2.5 mg/L (ref 0.0–4.9)

## 2017-02-17 LAB — MAGNESIUM: MAGNESIUM: 1.9 mg/dL (ref 1.6–2.3)

## 2017-02-17 LAB — COMP. METABOLIC PANEL (12)
AST: 22 IU/L (ref 0–40)
Albumin/Globulin Ratio: 1.5 (ref 1.2–2.2)
Albumin: 4.3 g/dL (ref 3.5–5.5)
Alkaline Phosphatase: 80 IU/L (ref 39–117)
BILIRUBIN TOTAL: 0.3 mg/dL (ref 0.0–1.2)
BUN / CREAT RATIO: 16 (ref 9–23)
BUN: 15 mg/dL (ref 6–24)
CALCIUM: 9.7 mg/dL (ref 8.7–10.2)
CREATININE: 0.92 mg/dL (ref 0.57–1.00)
Chloride: 95 mmol/L — ABNORMAL LOW (ref 96–106)
GFR calc Af Amer: 82 mL/min/{1.73_m2} (ref >59)
GFR calc non Af Amer: 71 mL/min/{1.73_m2} (ref >59)
Globulin, Total: 2.8 g/dL (ref 1.5–4.5)
Glucose: 103 mg/dL — ABNORMAL HIGH (ref 65–99)
Potassium: 4.2 mmol/L (ref 3.5–5.2)
Sodium: 135 mmol/L (ref 134–144)
TOTAL PROTEIN: 7.1 g/dL (ref 6.0–8.5)

## 2017-02-17 LAB — 25-HYDROXYVITAMIN D LCMS D2+D3
25-HYDROXY, VITAMIN D-2: 1.5 ng/mL
25-HYDROXY, VITAMIN D: 32 ng/mL

## 2017-02-17 LAB — 25-HYDROXY VITAMIN D LCMS D2+D3: 25-Hydroxy, Vitamin D-3: 30 ng/mL

## 2017-02-17 LAB — SEDIMENTATION RATE: Sed Rate: 15 mm/hr (ref 0–40)

## 2017-02-17 LAB — VITAMIN B12: Vitamin B-12: 1523 pg/mL — ABNORMAL HIGH (ref 232–1245)

## 2017-02-19 ENCOUNTER — Ambulatory Visit: Payer: BLUE CROSS/BLUE SHIELD | Admitting: Pain Medicine

## 2017-02-25 NOTE — Progress Notes (Addendum)
Patient's Name: Chelsea [name]  MRN: [id number]  Referring Provider: Maryland Pink, MD  DOB: 03/02/1961  PCP: Maryland Pink, MD  DOS: 02/26/2017  Note by: Gaspar Cola, MD  Service setting: Ambulatory outpatient  Specialty: Interventional Pain Management  Location: [facility] (AMB) Pain Management Facility    Patient type: Established   Primary Reason(s) for Visit: Encounter for evaluation before starting new chronic pain management plan of care (Level of risk: moderate) CC: Foot Pain (bilateral)  HPI  Chelsea [name] is a 56 y.o. year old, female patient, who comes today for a follow-up evaluation to review the test results and decide on a treatment plan. 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 Her primarily concern today is the Foot Pain (bilateral)  Pain Assessment: Location: Left, Right Foot Radiating: up to the calves Onset: More than a month ago Duration: Chronic pain Quality: Aching,  Burning, Sharp, Stabbing, Numbness Severity: 5 /10 (self-reported pain score)  Note: Reported level is inconsistent with clinical observations. Clinically the patient looks like a 2/10 A 2/10 is viewed as "Mild to Moderate" and described as noticeable and distracting. Impossible to hide from other people. More frequent flare-ups. Still possible to adapt and function close to normal. It can be very annoying and Spicer have occasional stronger flare-ups. With discipline, patients Mobley get used to it and adapt. Information on the proper use of the pain scale provided to the patient today. When using our objective Pain Scale, levels between 6 and 10/10 are said to belong in an emergency room, as it progressively worsens from a 6/10, described as severely limiting, requiring emergency care not usually available at an outpatient pain management facility. At a 6/10 level, communication becomes difficult and requires great effort. Assistance to reach the emergency department [place] be required. Facial flushing and profuse sweating along with potentially dangerous increases in heart rate and blood pressure will be evident. Effect on ADL: hard to walk. Timing: Constant Modifying factors: none.  Chelsea [name] comes in today for a follow-up visit after her initial evaluation on 02/06/2017. Today we went over the results of her tests. These were explained in "Layman's terms". During today's appointment we went over my diagnostic impression, as well as the proposed treatment plan.  According to the patient her primary area of pain is in her lower extremities; feet. She admits that the right side is greater than the left. She admits that this has been going on for several years. She has numbness, tingling and burning. She states that her feet feel cold however they're on fire. She admits that her feet are very sensitive. She does get occasional sharp, shooting, stabbing pain.  She is status post EMG which she states was negative.    Her next area of pain is in  her lower back. She admits that the right side is greater than the left. She denies any precipitating factors. She denies any previous surgery. She has had one epidural steroid injection by Dr. Sharlet Salina (12/21/2015 8:45 AM) (Right S1 transforaminal epidural injection under fluoroscopic guidance). She states this was effective for approximately one week. She has had recent MRI.  The patient has already seen the neurosurgeon at the [facility].  In considering the treatment plan options, Ms. Hennes was reminded that I no longer take patients for medication management only. I asked her to let me know if she had no intention of taking advantage of the interventional therapies, so that we could make arrangements to provide this space to someone interested. I also made it clear that undergoing interventional therapies for the purpose of getting pain medications is very inappropriate on the part of a patient, and it will not be tolerated in this practice. This type of behavior would suggest true addiction and therefore it requires referral to an addiction specialist.   Further details on both, my assessment(s), as well as the proposed treatment plan, please see below.  Controlled Substance Pharmacotherapy Assessment REMS (Risk Evaluation and Mitigation Strategy)  Analgesic: None Highest recorded MME/day: 50 mg/day MME/day: 0 mg/day Pill Count: None expected due to no prior prescriptions written by our practice. Charna Busman, NT  02/26/2017 10:29 AM  Signed Safety precautions to be maintained throughout the outpatient stay will include: orient to surroundings, keep bed in low position, maintain call bell within reach at all times, provide assistance with transfer out of bed and ambulation.    Pharmacokinetics: Liberation and absorption (onset of action): WNL Distribution (time to peak effect): WNL Metabolism and excretion (duration of action): WNL          Pharmacodynamics: Desired effects: Analgesia: Ms. Siems reports >50% benefit. Functional ability: Patient reports that medication allows her to accomplish basic ADLs Clinically meaningful improvement in function (CMIF): Sustained CMIF goals met Perceived effectiveness: Described as relatively effective, allowing for increase in activities of daily living (ADL) Undesirable effects: Side-effects or Adverse reactions: None reported Monitoring: [place] PMP: Online review of the past 41-monthperiod previously conducted. Not applicable at this point since we have not taken over the patient's medication management yet. List of other Serum/Urine Drug Screening Test(s):  No results found for: AMPHSCRSER, BARBSCRSER, BENZOSCRSER, COCAINSCRSER, COCAINSCRNUR, PCPSCRSER, THCSCRSER, THCU, CANNABQUANT, [place] of all UDS test(s) done:  Lab Results  Component Value Date   SUMMARY FINAL 02/06/2017   Last UDS on record: Summary  Date Value Ref Range Status  02/06/2017 FINAL  Final    Comment:    ==================================================================== TOXASSURE COMP DRUG ANALYSIS,UR ==================================================================== Test                             Result       Flag       Units Drug Present and Declared for Prescription Verification   Gabapentin                     PRESENT      EXPECTED   Amitriptyline                  PRESENT      EXPECTED   Nortriptyline                  PRESENT      EXPECTED  Nortriptyline is an expected metabolite of amitriptyline.   Citalopram                     PRESENT      EXPECTED   Desmethylcitalopram            PRESENT      EXPECTED    Desmethylcitalopram is an expected metabolite of citalopram or    the enantiomeric form, escitalopram. Drug Absent but Declared for Prescription Verification   Lorazepam                      Not Detected UNEXPECTED ng/mg creat   Pregabalin                      Not Detected UNEXPECTED   Acetaminophen                  Not Detected UNEXPECTED    Acetaminophen, as indicated in the declared medication list, is    not always detected even when used as directed.   Ibuprofen                      Not Detected UNEXPECTED    Ibuprofen, as indicated in the declared medication list, is not    always detected even when used as directed.   Naproxen                       Not Detected UNEXPECTED ==================================================================== Test                      Result    Flag   Units      Ref Range   Creatinine              68               mg/dL      >=20 ==================================================================== Declared Medications:  The flagging and interpretation on this report are based on the  following declared medications.  Unexpected results Neale arise from  inaccuracies in the declared medications.  **Note: The testing scope of this panel includes these medications:  Amitriptyline  Citalopram  Gabapentin  Lorazepam  Naproxen  Pregabalin  **Note: The testing scope of this panel does not include small to  moderate amounts of these reported medications:  Acetaminophen  Ibuprofen  **Note: The testing scope of this panel does not include following  reported medications:  Cyanocobalamin  Furosemide  Losartan (Losartan Potassium)  Omega-3 Fatty Acids ==================================================================== For clinical consultation, please call [phone]. ====================================================================    UDS interpretation: No unexpected findings. Patient informed of the CDC guidelines and recommendations to stay away from the concomitant use of benzodiazepines and opioids due to the increased risk of respiratory depression and death. Medication Assessment Form: Not applicable. Treatment compliance: Not applicable Risk Assessment Profile: Aberrant behavior: See  initial evaluations. None observed or detected today Comorbid factors increasing risk of overdose: See initial evaluation. No additional risks detected today Medical Psychology Evaluation: Please see scanned results in medical record. Opioid Risk Tool - 02/06/17 0833      Family History of Substance Abuse   Alcohol  Negative    Illegal Drugs  Negative    Rx Drugs  Negative      Personal History of Substance Abuse   Alcohol  Negative    Illegal Drugs  Negative    Rx Drugs  Negative      Psychological Disease   Psychological Disease  Positive    ADD  Negative    OCD  Negative    Bipolar  Negative    Schizophrenia  Negative    Depression  Positive      Total Score   Opioid Risk Tool Scoring  3    Opioid Risk Interpretation  Low Risk      ORT Scoring interpretation table:  Score <3 = Low Risk for SUD  Score between 4-7 = Moderate Risk for SUD  Score >8 = High Risk for Opioid Abuse   Risk Mitigation Strategies:  Patient opioid safety counseling:  Not applicable. Patient-Prescriber Agreement (PPA): No agreement signed.  Controlled substance notification to other providers: Not applicable  Pharmacologic Plan: The patient has expressed her desire to avoid opioids as much as possible             Laboratory Chemistry  Inflammation Markers (CRP: Acute Phase) (ESR: Chronic Phase) Lab Results  Component Value Date   CRP 2.5 02/06/2017   ESRSEDRATE 15 02/06/2017                 Renal Function Markers Lab Results  Component Value Date   BUN 15 02/06/2017   CREATININE 0.92 02/06/2017   GFRAA 82 02/06/2017   GFRNONAA 71 02/06/2017                 Hepatic Function Markers Lab Results  Component Value Date   AST 22 02/06/2017   ALBUMIN 4.3 02/06/2017   ALKPHOS 80 02/06/2017                 Electrolytes Lab Results  Component Value Date   NA 135 02/06/2017   K 4.2 02/06/2017   CL 95 (L) 02/06/2017   CALCIUM 9.7 02/06/2017   MG 1.9 02/06/2017                  Neuropathy Markers Lab Results  Component Value Date   VITAMINB12 1,523 (H) 02/06/2017                 Bone Pathology Markers Lab Results  Component Value Date   ALKPHOS 80 02/06/2017   25OHVITD1 32 02/06/2017   25OHVITD2 1.5 02/06/2017   25OHVITD3 30 02/06/2017   CALCIUM 9.7 02/06/2017                 Rheumatology Markers No results found for: LABURIC, URICUR              Coagulation Parameters Lab Results  Component Value Date   INR 0.9 04/30/2011   LABPROT 12.6 04/30/2011   APTT 28.7 04/30/2011   PLT 381 04/30/2011                 Cardiovascular Markers Lab Results  Component Value Date   HGB 14.3 04/30/2011   HCT 40.7 04/30/2011                 CA Markers No results found for: CEA, CA125, LABCA2               Note: Lab results reviewed.  Recent Diagnostic Imaging Review  Lumbosacral Imaging: Lumbar MR wo contrast:  Results for orders placed during the hospital encounter of 11/27/15  MR Lumbar Spine Wo Contrast   Narrative CLINICAL DATA:  No known injury. Low back pain. Bilateral leg pain.  EXAM: MRI LUMBAR SPINE WITHOUT CONTRAST  TECHNIQUE: Multiplanar, multisequence MR imaging  of the lumbar spine was performed. No intravenous contrast was administered.  COMPARISON:  None.  FINDINGS: Segmentation:  Standard.  Alignment:  Physiologic.  Vertebrae:  No fracture, evidence of discitis, or bone lesion.  Conus medullaris: Extends to the T12-L1 level and appears normal.  Paraspinal and other soft tissues: No paraspinal abnormality.  Disc levels:  Disc spaces: Disc spaces are maintained.  T12-L1: No significant disc bulge. No evidence of neural foraminal stenosis. No central canal stenosis.  L1-L2: No significant disc bulge. No evidence of neural foraminal stenosis. No central canal stenosis.  L2-L3: No significant disc bulge. No evidence of neural foraminal stenosis. No central canal stenosis.  L3-L4: No significant disc bulge. No  evidence of neural foraminal stenosis. No central canal stenosis. Mild left facet arthropathy.  L4-L5: Shallow right foraminal disc protrusion. No central canal stenosis. Moderate bilateral facet arthropathy. Mild right foraminal narrowing. No left foraminal narrowing.  L5-S1: No significant disc protrusion. No foraminal stenosis. Moderate right and mild left facet arthropathy. No central canal stenosis.  IMPRESSION: 1. At L4-5 there is a shallow right foraminal disc protrusion. Moderate bilateral facet arthropathy. Mild right foraminal narrowing. 2. At L5-S1 there is moderate right and mild left facet arthropathy.   Electronically Signed   By: Kathreen Devoid   On: 11/27/2015 13:43    Complexity Note: Imaging results reviewed. Results shared with Ms. Branford, using Layman's terms.                         Meds   Current Outpatient Medications:  .  acetaminophen (TYLENOL) 500 MG tablet, Take by mouth., Disp: , Rfl:  .  amitriptyline (ELAVIL) 10 MG tablet, TAKE 1 TO 2 TABLETS BY MOUTH AT BEDTIME, Disp: , Rfl:  .  citalopram (CELEXA) 20 MG tablet, Take 20 mg daily by mouth., Disp: , Rfl: 3 .  furosemide (LASIX) 20 MG tablet, Take by mouth., Disp: , Rfl:  .  gabapentin (NEURONTIN) 100 MG capsule, TAKE 1 CAPSULE BY MOUTH 3 TIMES A DAY . CAN TITRATE UP TO 3 CAPSULES (300MG) 3 TIMES A DAY ., Disp: , Rfl:  .  ibuprofen (ADVIL,MOTRIN) 200 MG tablet, Take by mouth., Disp: , Rfl:  .  LORazepam (ATIVAN) 0.5 MG tablet, Take by mouth., Disp: , Rfl:  .  losartan (COZAAR) 100 MG tablet, Take 100 mg daily by mouth., Disp: , Rfl: 10 .  naproxen sodium (ALEVE) 220 MG tablet, Take by mouth., Disp: , Rfl:  .  Omega-3 Fatty Acids (FISH OIL PO), Take by mouth., Disp: , Rfl:  .  vitamin B-12 (CYANOCOBALAMIN) 1000 MCG tablet, Take by mouth., Disp: , Rfl:   ROS  Constitutional: Denies any fever or chills Gastrointestinal: No reported hemesis, hematochezia, vomiting, or acute GI distress Musculoskeletal:  Denies any acute onset joint swelling, redness, loss of ROM, or weakness Neurological: No reported episodes of acute onset apraxia, aphasia, dysarthria, agnosia, amnesia, paralysis, loss of coordination, or loss of consciousness  Allergies  Ms. Sampsel is allergic to codeine.  PFSH  Drug: Ms. Vanetten  reports that she does not use drugs. Alcohol:  reports that she drinks alcohol. Tobacco:  reports that she has been smoking cigarettes.  She has a 39.00 pack-year smoking history. she has never used smokeless tobacco. Medical:  has a past medical history of Allergy, Anxiety, Depression, Hyperlipidemia, Hypertension, Sleep apnea, Vitamin B12 deficiency, and Vitamin D deficiency. Surgical: Ms. Hakimi  has a past surgical history that includes Abdominal  hysterectomy; Tubal ligation; and nose. Family: family history includes Cancer in her father.  Constitutional Exam  General appearance: Well nourished, well developed, and well hydrated. In no apparent acute distress Vitals:   02/26/17 0825  BP: (!) 173/90  Pulse: 97  Resp: 16  Temp: 98.5 F (36.9 C)  TempSrc: Oral  SpO2: 99%  Weight: 205 lb (93 kg)  Height: _0  (1.6 m)   BMI Assessment: Estimated body mass index is 36.31 kg/m as calculated from the following:   Height as of this encounter: _1  (1.6 m).   Weight as of this encounter: 205 lb (93 kg).  BMI interpretation table: BMI level Category Range association with higher incidence of chronic pain  <18 kg/m2 Underweight   18.5-24.9 kg/m2 Ideal body weight   25-29.9 kg/m2 Overweight Increased incidence by 20%  30-34.9 kg/m2 Obese (Class I) Increased incidence by 68%  35-39.9 kg/m2 Severe obesity (Class II) Increased incidence by 136%  >40 kg/m2 Extreme obesity (Class III) Increased incidence by 254%   BMI Readings from Last 4 Encounters:  02/26/17 36.31 kg/m  02/06/17 36.31 kg/m   Wt Readings from Last 4 Encounters:  02/26/17 205 lb (93 kg)  02/06/17 205 lb (93 kg)  Psych/Mental  status: Alert, oriented x 3 (person, place, & time)       Eyes: PERLA Respiratory: No evidence of acute respiratory distress  Cervical Spine Area Exam  Skin & Axial Inspection: No masses, redness, edema, swelling, or associated skin lesions Alignment: Symmetrical Functional ROM: Unrestricted ROM      Stability: No instability detected Muscle Tone/Strength: Functionally intact. No obvious neuro-muscular anomalies detected. Sensory (Neurological): Unimpaired Palpation: No palpable anomalies              Upper Extremity (UE) Exam    Side: Right upper extremity  Side: Left upper extremity  Skin & Extremity Inspection: Skin color, temperature, and hair growth are WNL. No peripheral edema or cyanosis. No masses, redness, swelling, asymmetry, or associated skin lesions. No contractures.  Skin & Extremity Inspection: Skin color, temperature, and hair growth are WNL. No peripheral edema or cyanosis. No masses, redness, swelling, asymmetry, or associated skin lesions. No contractures.  Functional ROM: Unrestricted ROM          Functional ROM: Unrestricted ROM          Muscle Tone/Strength: Functionally intact. No obvious neuro-muscular anomalies detected.  Muscle Tone/Strength: Functionally intact. No obvious neuro-muscular anomalies detected.  Sensory (Neurological): Unimpaired          Sensory (Neurological): Unimpaired          Palpation: No palpable anomalies              Palpation: No palpable anomalies              Specialized Test(s): Deferred         Specialized Test(s): Deferred          Thoracic Spine Area Exam  Skin & Axial Inspection: No masses, redness, or swelling Alignment: Symmetrical Functional ROM: Unrestricted ROM Stability: No instability detected Muscle Tone/Strength: Functionally intact. No obvious neuro-muscular anomalies detected. Sensory (Neurological): Unimpaired Muscle strength & Tone: No palpable anomalies  Lumbar Spine Area Exam  Skin & Axial Inspection: No masses,  redness, or swelling Alignment: Symmetrical Functional ROM: Decreased ROM      Stability: No instability detected Muscle Tone/Strength: Functionally intact. No obvious neuro-muscular anomalies detected. Sensory (Neurological): Movement-associated pain Palpation: Complains of area being tender to palpation  Provocative Tests: Lumbar Hyperextension and rotation test: Positive bilaterally for facet joint pain. Lumbar Lateral bending test: evaluation deferred today       Patrick's Maneuver: Positive for bilateral S-I arthralgia and for bilateral hip arthralgia  Gait & Posture Assessment  Ambulation: Unassisted Gait: Antalgic Posture: Difficulty standing up straight, due to pain   Lower Extremity Exam    Side: Right lower extremity  Side: Left lower extremity  Skin & Extremity Inspection: Skin color, temperature, and hair growth are WNL. No peripheral edema or cyanosis. No masses, redness, swelling, asymmetry, or associated skin lesions. No contractures.  Skin & Extremity Inspection: Skin color, temperature, and hair growth are WNL. No peripheral edema or cyanosis. No masses, redness, swelling, asymmetry, or associated skin lesions. No contractures.  Functional ROM: Decreased ROM for hip joint  Functional ROM: Decreased ROM for hip joint  Muscle Tone/Strength: Guarding  Muscle Tone/Strength: TEFL teacher (Neurological): Arthropathic arthralgia  Sensory (Neurological): Arthropathic arthralgia  Palpation: No palpable anomalies  Palpation: No palpable anomalies   Assessment & Plan  Primary Diagnosis & Pertinent Problem List: The primary encounter diagnosis was Chronic pain syndrome. 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  Visit Diagnosis: 1. Chronic pain syndrome   2. Chronic pain of lower extremity (Primary Area of Pain) (Bilateral) (R>L)   3. Lumbar spondylosis with radiculitis (Bilateral) (L5)   4. Chronic L5 Lumbar radiculitis (Bilateral)   5. L4-5 foraminal disc protrusion (Right)   6. 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 Chronic foot pain (Bilateral) (R>L)   8. 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 Chronic low back pain (Secondary Area of Pain) (Bilateral) (R>L)   10. Chronic sacroiliac joint pain (Bilateral) (R>L)   11. Lumbar facet arthropathy (Bilateral)   12. Lumbar facet osteoarthritis   13. Lumbar facet syndrome (Bilateral) (R>L)   14. Osteoarthritis lumbar spine   15. Chronic hip pain (Bilateral) (R>L)   16. Osteoarthritis of hip (Bilateral)   17. Osteoarthritis   18. Disorder of skeletal system   19. Pharmacologic therapy   20. Problems influencing health status   21. OSA (obstructive sleep apnea)   22. Abnormal MRI, lumbar spine    Problems updated and reviewed during this visit: Problem  Abnormal MRI, lumbar spine (11/27/2015)   FINDINGS: L3-4: Left facet arthropathy. L4-5: Shallow right foraminal disc protrusion. Moderate bilateral facet arthropathy. Mild right foraminal narrowing. L5-S1: Moderate right and mild left facet arthropathy.   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 Possible small fiber disease cannot be ruled out.   Chronic L5 Lumbar radiculitis (Bilateral) (R>L)   According to the patient the pain pattern goes through the top of the foot and into the big  toe, bilaterally, with the right being worst on the left.   Lumbar facet osteoarthritis (Bilateral)   Time Note: Greater than 50% of the 40 minute(s) of face-to-face time spent with Ms. Rusnak, was spent in counseling/coordination of care regarding: Ms. Roughton primary cause of pain, the results of her recent test(s), the significance of each one oth the test(s) anomalies and it's corresponding characteristic pain pattern(s), the treatment plan, treatment alternatives, the risks and possible complications of proposed treatment, realistic expectations, the goals of pain management (increased in functionality), the need to bring and keep the BMI below 30 and the need to collect and read the AVS material.  Plan of Care  Pharmacotherapy (Medications Ordered): No orders of the defined types were placed in this encounter.   Procedure Orders     Lumbar Transforaminal Epidural Lab Orders  No laboratory test(s) ordered today   Imaging Orders  No imaging studies ordered today   Referral Orders  No referral(s) requested today    Pharmacological management options:  Opioid Analgesics: Ms. 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 Diagnostic bilateral lumbar sympathetic blocks  Possible bilateral lumbar facet RFA  Diagnostic bilateral lumbar facet nerve block  Possible bilateral lumbar facet RFA  Diagnostic bilateral sacroiliac joint block  Possible bilateral sacroiliac joint RFA  Diagnostic bilateral intra-articular hip joint injection  Diagnostic bilateral femoral nerve + obturator nerve block  Possible bilateral femoral nerve + obturator nerve RFA Possible spinal cord stimulator trial    PRN Procedures:   None at this time   Provider-requested follow-up: Return for Procedure (w/ sedation): (B) L5 TFESI.  Future Appointments  Date Time Provider [place]  03/08/2017  8:15 AM Milinda Pointer, MD [facility] None    Primary Care Physician: Maryland Pink, MD Location: [facility] Note by: Gaspar Cola, MD Date: 02/26/2017; Time: 5:31 PM

## 2017-02-26 ENCOUNTER — Ambulatory Visit: Payer: BLUE CROSS/BLUE SHIELD | Attending: Pain Medicine | Admitting: Pain Medicine

## 2017-02-26 ENCOUNTER — Encounter: Payer: Self-pay | Admitting: Pain Medicine

## 2017-02-26 VITALS — BP 173/90 | HR 97 | Temp 98.5°F | Resp 16 | Ht 63.0 in | Wt 205.0 lb

## 2017-02-26 DIAGNOSIS — Z791 Long term (current) use of non-steroidal anti-inflammatories (NSAID): Secondary | ICD-10-CM | POA: Diagnosis not present

## 2017-02-26 DIAGNOSIS — M5126 Other intervertebral disc displacement, lumbar region: Secondary | ICD-10-CM | POA: Diagnosis not present

## 2017-02-26 DIAGNOSIS — Z9889 Other specified postprocedural states: Secondary | ICD-10-CM | POA: Insufficient documentation

## 2017-02-26 DIAGNOSIS — M150 Primary generalized (osteo)arthritis: Secondary | ICD-10-CM

## 2017-02-26 DIAGNOSIS — G894 Chronic pain syndrome: Secondary | ICD-10-CM | POA: Insufficient documentation

## 2017-02-26 DIAGNOSIS — M4726 Other spondylosis with radiculopathy, lumbar region: Secondary | ICD-10-CM

## 2017-02-26 DIAGNOSIS — M25551 Pain in right hip: Secondary | ICD-10-CM | POA: Insufficient documentation

## 2017-02-26 DIAGNOSIS — Z809 Family history of malignant neoplasm, unspecified: Secondary | ICD-10-CM | POA: Insufficient documentation

## 2017-02-26 DIAGNOSIS — M5442 Lumbago with sciatica, left side: Secondary | ICD-10-CM | POA: Diagnosis not present

## 2017-02-26 DIAGNOSIS — M5116 Intervertebral disc disorders with radiculopathy, lumbar region: Secondary | ICD-10-CM | POA: Insufficient documentation

## 2017-02-26 DIAGNOSIS — M48061 Spinal stenosis, lumbar region without neurogenic claudication: Secondary | ICD-10-CM | POA: Diagnosis not present

## 2017-02-26 DIAGNOSIS — Z885 Allergy status to narcotic agent status: Secondary | ICD-10-CM | POA: Diagnosis not present

## 2017-02-26 DIAGNOSIS — G4733 Obstructive sleep apnea (adult) (pediatric): Secondary | ICD-10-CM | POA: Insufficient documentation

## 2017-02-26 DIAGNOSIS — G629 Polyneuropathy, unspecified: Secondary | ICD-10-CM | POA: Diagnosis not present

## 2017-02-26 DIAGNOSIS — F419 Anxiety disorder, unspecified: Secondary | ICD-10-CM | POA: Insufficient documentation

## 2017-02-26 DIAGNOSIS — M79674 Pain in right toe(s): Secondary | ICD-10-CM | POA: Diagnosis not present

## 2017-02-26 DIAGNOSIS — M533 Sacrococcygeal disorders, not elsewhere classified: Secondary | ICD-10-CM | POA: Insufficient documentation

## 2017-02-26 DIAGNOSIS — M9983 Other biomechanical lesions of lumbar region: Secondary | ICD-10-CM | POA: Diagnosis not present

## 2017-02-26 DIAGNOSIS — M5416 Radiculopathy, lumbar region: Secondary | ICD-10-CM | POA: Diagnosis not present

## 2017-02-26 DIAGNOSIS — F329 Major depressive disorder, single episode, unspecified: Secondary | ICD-10-CM | POA: Insufficient documentation

## 2017-02-26 DIAGNOSIS — M79604 Pain in right leg: Secondary | ICD-10-CM | POA: Diagnosis not present

## 2017-02-26 DIAGNOSIS — M47816 Spondylosis without myelopathy or radiculopathy, lumbar region: Secondary | ICD-10-CM | POA: Diagnosis not present

## 2017-02-26 DIAGNOSIS — G609 Hereditary and idiopathic neuropathy, unspecified: Secondary | ICD-10-CM

## 2017-02-26 DIAGNOSIS — M79605 Pain in left leg: Secondary | ICD-10-CM

## 2017-02-26 DIAGNOSIS — R937 Abnormal findings on diagnostic imaging of other parts of musculoskeletal system: Secondary | ICD-10-CM

## 2017-02-26 DIAGNOSIS — E559 Vitamin D deficiency, unspecified: Secondary | ICD-10-CM | POA: Insufficient documentation

## 2017-02-26 DIAGNOSIS — Z79899 Other long term (current) drug therapy: Secondary | ICD-10-CM | POA: Diagnosis not present

## 2017-02-26 DIAGNOSIS — M899 Disorder of bone, unspecified: Secondary | ICD-10-CM

## 2017-02-26 DIAGNOSIS — F17210 Nicotine dependence, cigarettes, uncomplicated: Secondary | ICD-10-CM | POA: Insufficient documentation

## 2017-02-26 DIAGNOSIS — M160 Bilateral primary osteoarthritis of hip: Secondary | ICD-10-CM | POA: Diagnosis not present

## 2017-02-26 DIAGNOSIS — E782 Mixed hyperlipidemia: Secondary | ICD-10-CM | POA: Insufficient documentation

## 2017-02-26 DIAGNOSIS — M159 Polyosteoarthritis, unspecified: Secondary | ICD-10-CM

## 2017-02-26 DIAGNOSIS — M5441 Lumbago with sciatica, right side: Secondary | ICD-10-CM

## 2017-02-26 DIAGNOSIS — G8929 Other chronic pain: Secondary | ICD-10-CM

## 2017-02-26 DIAGNOSIS — Z90710 Acquired absence of both cervix and uterus: Secondary | ICD-10-CM | POA: Diagnosis not present

## 2017-02-26 DIAGNOSIS — M25552 Pain in left hip: Secondary | ICD-10-CM | POA: Insufficient documentation

## 2017-02-26 DIAGNOSIS — I10 Essential (primary) hypertension: Secondary | ICD-10-CM | POA: Diagnosis not present

## 2017-02-26 DIAGNOSIS — M79675 Pain in left toe(s): Secondary | ICD-10-CM

## 2017-02-26 DIAGNOSIS — Z789 Other specified health status: Secondary | ICD-10-CM | POA: Insufficient documentation

## 2017-02-26 NOTE — Patient Instructions (Addendum)
____________________________________________________________________________________________  Preparing for Procedure with Sedation Instructions: . Oral Intake: Do not eat or drink anything for at least 8 hours prior to your procedure. . Transportation: Public transportation is not allowed. Bring an adult driver. The driver must be physically present in our waiting room before any procedure can be started. Marland Kitchen Physical Assistance: Bring an adult physically capable of assisting you, in the event you need help. This adult should keep you company at home for at least 6 hours after the procedure. . Blood Pressure Medicine: Take your blood pressure medicine with a sip of water the morning of the procedure. . Blood thinners:  . Diabetics on insulin: Notify the staff so that you can be scheduled 1st case in the morning. If your diabetes requires high dose insulin, take only  of your normal insulin dose the morning of the procedure and notify the staff that you have done so. . Preventing infections: Shower with an antibacterial soap the morning of your procedure. . Build-up your immune system: Take 1000 mg of Vitamin C with every meal (3 times a day) the day prior to your procedure. Marland Kitchen Antibiotics: Inform the staff if you have a condition or reason that requires you to take antibiotics before dental procedures. . Pregnancy: If you are pregnant, call and cancel the procedure. . Sickness: If you have a cold, fever, or any active infections, call and cancel the procedure. . Arrival: You must be in the facility at least 30 minutes prior to your scheduled procedure. . Children: Do not bring children with you. . Dress appropriately: Bring dark clothing that you would not mind if they get stained. . Valuables: Do not bring any jewelry or valuables. Procedure appointments are reserved for interventional treatments only. Marland Kitchen No Prescription Refills. . No medication changes will be discussed during procedure  appointments. . No disability issues will be discussed. ____________________________________________________________________________________________   ____________________________________________________________________________________________  Pain Scale  Introduction: The pain score used by this practice is the Verbal Numerical Rating Scale (VNRS-11). This is an 11-point scale. It is for adults and children 10 years or older. There are significant differences in how the pain score is reported, used, and applied. Forget everything you learned in the past and learn this scoring system.  General Information: The scale should reflect your current level of pain. Unless you are specifically asked for the level of your worst pain, or your average pain. If you are asked for one of these two, then it should be understood that it is over the past 24 hours.  Basic Activities of Daily Living (ADL): Personal hygiene, dressing, eating, transferring, and using restroom.  Instructions: Most patients tend to report their level of pain as a combination of two factors, their physical pain and their psychosocial pain. This last one is also known as "suffering" and it is reflection of how physical pain affects you socially and psychologically. From now on, report them separately. From this point on, when asked to report your pain level, report only your physical pain. Use the following table for reference.  Pain Clinic Pain Levels (0-5/10)  Pain Level Score  Description  No Pain 0   Mild pain 1 Nagging, annoying, but does not interfere with basic activities of daily living (ADL). Patients are able to eat, bathe, get dressed, toileting (being able to get on and off the toilet and perform personal hygiene functions), transfer (move in and out of bed or a chair without assistance), and maintain continence (able to control bladder and  bowel functions). Blood pressure and heart rate are unaffected. A normal heart rate  for a healthy adult ranges from 60 to 100 bpm (beats per minute).   Mild to moderate pain 2 Noticeable and distracting. Impossible to hide from other people. More frequent flare-ups. Still possible to adapt and function close to normal. It can be very annoying and Daum have occasional stronger flare-ups. With discipline, patients Stanwood get used to it and adapt.   Moderate pain 3 Interferes significantly with activities of daily living (ADL). It becomes difficult to feed, bathe, get dressed, get on and off the toilet or to perform personal hygiene functions. Difficult to get in and out of bed or a chair without assistance. Very distracting. With effort, it can be ignored when deeply involved in activities.   Moderately severe pain 4 Impossible to ignore for more than a few minutes. With effort, patients Rainone still be able to manage work or participate in some social activities. Very difficult to concentrate. Signs of autonomic nervous system discharge are evident: dilated pupils (mydriasis); mild sweating (diaphoresis); sleep interference. Heart rate becomes elevated (>115 bpm). Diastolic blood pressure (lower number) rises above 100 mmHg. Patients find relief in laying down and not moving.   Severe pain 5 Intense and extremely unpleasant. Associated with frowning face and frequent crying. Pain overwhelms the senses.  Ability to do any activity or maintain social relationships becomes significantly limited. Conversation becomes difficult. Pacing back and forth is common, as getting into a comfortable position is nearly impossible. Pain wakes you up from deep sleep. Physical signs will be obvious: pupillary dilation; increased sweating; goosebumps; brisk reflexes; cold, clammy hands and feet; nausea, vomiting or dry heaves; loss of appetite; significant sleep disturbance with inability to fall asleep or to remain asleep. When persistent, significant weight loss is observed due to the complete loss of appetite and  sleep deprivation.  Blood pressure and heart rate becomes significantly elevated. Caution: If elevated blood pressure triggers a pounding headache associated with blurred vision, then the patient should immediately seek attention at an urgent or emergency care unit, as these Whitworth be signs of an impending stroke.    Emergency Department Pain Levels (6-10/10)  Emergency Room Pain 6 Severely limiting. Requires emergency care and should not be seen or managed at an outpatient pain management facility. Communication becomes difficult and requires great effort. Assistance to reach the emergency department Peabody be required. Facial flushing and profuse sweating along with potentially dangerous increases in heart rate and blood pressure will be evident.   Distressing pain 7 Self-care is very difficult. Assistance is required to transport, or use restroom. Assistance to reach the emergency department will be required. Tasks requiring coordination, such as bathing and getting dressed become very difficult.   Disabling pain 8 Self-care is no longer possible. At this level, pain is disabling. The individual is unable to do even the most "basic" activities such as walking, eating, bathing, dressing, transferring to a bed, or toileting. Fine motor skills are lost. It is difficult to think clearly.   Incapacitating pain 9 Pain becomes incapacitating. Thought processing is no longer possible. Difficult to remember your own name. Control of movement and coordination are lost.   The worst pain imaginable 10 At this level, most patients pass out from pain. When this level is reached, collapse of the autonomic nervous system occurs, leading to a sudden drop in blood pressure and heart rate. This in turn results in a temporary and dramatic drop in blood  flow to the brain, leading to a loss of consciousness. Fainting is one of the body's self defense mechanisms. Passing out puts the brain in a calmed state and causes it to shut  down for a while, in order to begin the healing process.    Summary: 1. Refer to this scale when providing us with your pain level. 2. Be accurate and careful when reporting your pain level. This will help with your care. 3. Over-reporting your pain level will lead to loss of credibility. 4. Even a level of 1/10 means that there is pain and will be treated at our facility. 5. High, inaccurate reporting will be documented as "Symptom Exaggeration", leading to loss of credibility and suspicions of possible secondary gains such as obtaining more narcotics, or wanting to appear disabled, for fraudulent reasons. 6. Only pain levels of 5 or below will be seen at our facility. 7. Pain levels of 6 and above will be sent to the Emergency Department and the appointment cancelled. ____________________________________________________________________________________________   Selective Nerve Root Block Patient Information  Description: Specific nerve roots exit the spinal canal and these nerves can be compressed and inflamed by a bulging disc and bone spurs.  By injecting steroids on the nerve root, we can potentially decrease the inflammation surrounding these nerves, which often leads to decreased pain.  Also, by injecting local anesthesia on the nerve root, this can provide us helpful information to give to your referring doctor if it decreases your pain.  Selective nerve root blocks can be done along the spine from the neck to the low back depending on the location of your pain.   After numbing the skin with local anesthesia, a small needle is passed to the nerve root and the position of the needle is verified using x-ray pictures.  After the needle is in correct position, we then deposit the medication.  You Fuster experience a pressure sensation while this is being done.  The entire block usually lasts less than 15 minutes.  Conditions that Daisey be treated with selective nerve root blocks:  Low back and  leg pain  Spinal stenosis  Diagnostic block prior to potential surgery  Neck and arm pain  Post laminectomy syndrome  Preparation for the injection:  1. Do not eat any solid food or dairy products within 8 hours of your appointment. 2. You Guimont drink clear liquids up to 3 hours before an appointment.  Clear liquids include water, black coffee, juice or soda.  No milk or cream please. 3. You Savage take your regular medications, including pain medications, with a sip of water before your appointment.  Diabetics should hold regular insulin (if taken separately) and take 1/2 normal NPH dose the morning of the procedure.  Carry some sugar containing items with you to your appointment. 4. A driver must accompany you and be prepared to drive you home after your procedure. 5. Bring all your current medications with you. 6. An IV Seibold be inserted and sedation Ciolek be given at the discretion of the physician. 7. A blood pressure cuff, EKG, and other monitors will often be applied during the procedure.  Some patients Legler need to have extra oxygen administered for a short period. 8. You will be asked to provide medical information, including allergies, prior to the procedure.  We must know immediately if you are taking blood  Thinners (like Coumadin) or if you are allergic to IV iodine contrast (dye).  Possible side-effects: All are usually temporary  Bleeding  from needle site  Light headedness  Numbness and tingling  Decreased blood pressure  Weakness in arms/legs  Pressure sensation in back/neck  Pain at injection site (several days)  Possible complications: All are extremely rare  Infection  Nerve injury  Spinal headache (a headache wore with upright position)  Call if you experience:  Fever/chills associated with headache or increased back/neck pain  Headache worsened by an upright position  New onset weakness or numbness of an extremity below the injection site  Hives or  difficulty breathing (go to the emergency room)  Inflammation or drainage at the injection site(s)  Severe back/neck pain greater than usual  New symptoms which are concerning to you  Please note:  Although the local anesthetic injected can often make your back or neck feel good for several hours after the injection the pain will likely return.  It takes 3-5 days for steroids to work on the nerve root. You Basulto not notice any pain relief for at least one week.  If effective, we will often do a series of 3 injections spaced 3-6 weeks apart to maximally decrease your pain.    If you have any questions, please call [phone] [facility] Clinic[facility] Management Discharge Instructions  General Discharge Instructions :  If you need to reach your doctor call: Monday-Friday 8:00 am - 4:00 pm at [phone] or toll free [phone].  After clinic hours [phone] to have operator reach doctor.  Bring all of your medication bottles to all your appointments in the pain clinic.  To cancel or reschedule your appointment with Pain Management please remember to call 24 hours in advance to avoid a fee.  Refer to the educational materials which you have been given on: General Risks, I had my Procedure. Discharge Instructions, Post Sedation.  Post Procedure Instructions:  The drugs you were given will stay in your system until tomorrow, so for the next 24 hours you should not drive, make any legal decisions or drink any alcoholic beverages.  You Tillison eat anything you prefer, but it is better to start with liquids then soups and crackers, and gradually work up to solid foods.  Please notify your doctor immediately if you have any unusual bleeding, trouble breathing or pain that is not related to your normal pain.  Depending on the type of procedure that was done, some parts of your body Busenbark feel week and/or numb.  This usually clears up by tonight or the next  day.  Walk with the use of an assistive device or accompanied by an adult for the 24 hours.  You Carsey use ice on the affected area for the first 24 hours.  Put ice in a Ziploc bag and cover with a towel and place against area 15 minutes on 15 minutes off.  You Schweizer switch to heat after 24 hours.GENERAL RISKS AND COMPLICATIONS  What are the risk, side effects and possible complications? Generally speaking, most procedures are safe.  However, with any procedure there are risks, side effects, and the possibility of complications.  The risks and complications are dependent upon the sites that are lesioned, or the type of nerve block to be performed.  The closer the procedure is to the spine, the more serious the risks are.  Great care is taken when placing the radio frequency needles, block needles or lesioning probes, but sometimes complications can occur. 1. Infection: Any time there is an injection through the skin, there is a risk of infection.  This is why sterile conditions are used for these blocks.  There are four possible types of infection. 1. Localized skin infection. 2. Central Nervous System Infection-This can be in the form of Meningitis, which can be deadly. 3. Epidural Infections-This can be in the form of an epidural abscess, which can cause pressure inside of the spine, causing compression of the spinal cord with subsequent paralysis. This would require an emergency surgery to decompress, and there are no guarantees that the patient would recover from the paralysis. 4. Discitis-This is an infection of the intervertebral discs.  It occurs in about 1% of discography procedures.  It is difficult to treat and it Formoso lead to surgery.        2. Pain: the needles have to go through skin and soft tissues, will cause soreness.       3. Damage to internal structures:  The nerves to be lesioned Barillas be near blood vessels or    other nerves which can be potentially damaged.       4. Bleeding:  Bleeding is more common if the patient is taking blood thinners such as  aspirin, Coumadin, Ticiid, Plavix, etc., or if he/she have some genetic predisposition  such as hemophilia. Bleeding into the spinal canal can cause compression of the spinal  cord with subsequent paralysis.  This would require an emergency surgery to  decompress and there are no guarantees that the patient would recover from the  paralysis.       5. Pneumothorax:  Puncturing of a lung is a possibility, every time a needle is introduced in  the area of the chest or upper back.  Pneumothorax refers to free air around the  collapsed lung(s), inside of the thoracic cavity (chest cavity).  Another two possible  complications related to a similar event would include: Hemothorax and Chylothorax.   These are variations of the Pneumothorax, where instead of air around the collapsed  lung(s), you Savary have blood or chyle, respectively.       6. Spinal headaches: They Leicht occur with any procedures in the area of the spine.       7. Persistent CSF (Cerebro-Spinal Fluid) leakage: This is a rare problem, but Dillie occur  with prolonged intrathecal or epidural catheters either due to the formation of a fistulous  track or a dural tear.       8. Nerve damage: By working so close to the spinal cord, there is always a possibility of  nerve damage, which could be as serious as a permanent spinal cord injury with  paralysis.       9. Death:  Although rare, severe deadly allergic reactions known as "Anaphylactic  reaction" can occur to any of the medications used.      10. Worsening of the symptoms:  We can always make thing worse.  What are the chances of something like this happening? Chances of any of this occuring are extremely low.  By statistics, you have more of a chance of getting killed in a motor vehicle accident: while driving to the hospital than any of the above occurring .  Nevertheless, you should be aware that they are possibilities.  In  general, it is similar to taking a shower.  Everybody knows that you can slip, hit your head and get killed.  Does that mean that you should not shower again?  Nevertheless always keep in mind that statistics do not mean anything if you happen to be on the  wrong side of them.  Even if a procedure has a 1 (one) in a 1,000,000 (million) chance of going wrong, it you happen to be that one..Also, keep in mind that by statistics, you have more of a chance of having something go wrong when taking medications.  Who should not have this procedure? If you are on a blood thinning medication (e.g. Coumadin, Plavix, see list of "Blood Thinners"), or if you have an active infection going on, you should not have the procedure.  If you are taking any blood thinners, please inform your physician.  How should I prepare for this procedure?  Do not eat or drink anything at least six hours prior to the procedure.  Bring a driver with you .  It cannot be a taxi.  Come accompanied by an adult that can drive you back, and that is strong enough to help you if your legs get weak or numb from the local anesthetic.  Take all of your medicines the morning of the procedure with just enough water to swallow them.  If you have diabetes, make sure that you are scheduled to have your procedure done first thing in the morning, whenever possible.  If you have diabetes, take only half of your insulin dose and notify our nurse that you have done so as soon as you arrive at the clinic.  If you are diabetic, but only take blood sugar pills (oral hypoglycemic), then do not take them on the morning of your procedure.  You Parsell take them after you have had the procedure.  Do not take aspirin or any aspirin-containing medications, at least eleven (11) days prior to the procedure.  They Weier prolong bleeding.  Wear loose fitting clothing that Deol be easy to take off and that you would not mind if it got stained with Betadine or  blood.  Do not wear any jewelry or perfume  Remove any nail coloring.  It will interfere with some of our monitoring equipment.  NOTE: Remember that this is not meant to be interpreted as a complete list of all possible complications.  Unforeseen problems Ohmann occur.  BLOOD THINNERS The following drugs contain aspirin or other products, which can cause increased bleeding during surgery and should not be taken for 2 weeks prior to and 1 week after surgery.  If you should need take something for relief of minor pain, you Cristo take acetaminophen which is found in Tylenol,m Datril, Anacin-3 and Panadol. It is not blood thinner. The products listed below are.  Do not take any of the products listed below in addition to any listed on your instruction sheet.  A.P.C or A.P.C with Codeine Codeine Phosphate Capsules #3 Ibuprofen Ridaura  ABC compound Congesprin Imuran rimadil  Advil Cope Indocin Robaxisal  Alka-Seltzer Effervescent Pain Reliever and Antacid Coricidin or Coricidin-D  Indomethacin Rufen  Alka-Seltzer plus Cold Medicine Cosprin Ketoprofen S-A-C Tablets  Anacin Analgesic Tablets or Capsules Coumadin Korlgesic Salflex  Anacin Extra Strength Analgesic tablets or capsules CP-2 Tablets Lanoril Salicylate  Anaprox Cuprimine Capsules Levenox Salocol  Anexsia-D Dalteparin Magan Salsalate  Anodynos Darvon compound Magnesium Salicylate Sine-off  Ansaid Dasin Capsules Magsal Sodium Salicylate  Anturane Depen Capsules Marnal Soma  APF Arthritis pain formula Dewitt's Pills Measurin Stanback  Argesic Dia-Gesic Meclofenamic Sulfinpyrazone  Arthritis Bayer Timed Release Aspirin Diclofenac Meclomen Sulindac  Arthritis pain formula Anacin Dicumarol Medipren Supac  Analgesic (Safety coated) Arthralgen Diffunasal Mefanamic Suprofen  Arthritis Strength Bufferin Dihydrocodeine Mepro Compound Suprol  Arthropan liquid  Dopirydamole Methcarbomol with Aspirin Synalgos  ASA tablets/Enseals Disalcid Micrainin  Tagament  Ascriptin Doan's Midol Talwin  Ascriptin A/D Dolene Mobidin Tanderil  Ascriptin Extra Strength Dolobid Moblgesic Ticlid  Ascriptin with Codeine Doloprin or Doloprin with Codeine Momentum Tolectin  Asperbuf Duoprin Mono-gesic Trendar  Aspergum Duradyne Motrin or Motrin IB Triminicin  Aspirin plain, buffered or enteric coated Durasal Myochrisine Trigesic  Aspirin Suppositories Easprin Nalfon Trillsate  Aspirin with Codeine Ecotrin Regular or Extra Strength Naprosyn Uracel  Atromid-S Efficin Naproxen Ursinus  Auranofin Capsules Elmiron Neocylate Vanquish  Axotal Emagrin Norgesic Verin  Azathioprine Empirin or Empirin with Codeine Normiflo Vitamin E  Azolid Emprazil Nuprin Voltaren  Bayer Aspirin plain, buffered or children's or timed BC Tablets or powders Encaprin Orgaran Warfarin Sodium  Buff-a-Comp Enoxaparin Orudis Zorpin  Buff-a-Comp with Codeine Equegesic Os-Cal-Gesic   Buffaprin Excedrin plain, buffered or Extra Strength Oxalid   Bufferin Arthritis Strength Feldene Oxphenbutazone   Bufferin plain or Extra Strength Feldene Capsules Oxycodone with Aspirin   Bufferin with Codeine Fenoprofen Fenoprofen Pabalate or Pabalate-SF   Buffets II Flogesic Panagesic   Buffinol plain or Extra Strength Florinal or Florinal with Codeine Panwarfarin   Buf-Tabs Flurbiprofen Penicillamine   Butalbital Compound Four-way cold tablets Penicillin   Butazolidin Fragmin Pepto-Bismol   Carbenicillin Geminisyn Percodan   Carna Arthritis Reliever Geopen Persantine   Carprofen Gold's salt Persistin   Chloramphenicol Goody's Phenylbutazone   Chloromycetin Haltrain Piroxlcam   Clmetidine heparin Plaquenil   Cllnoril Hyco-pap Ponstel   Clofibrate Hydroxy chloroquine Propoxyphen         Before stopping any of these medications, be sure to consult the physician who ordered them.  Some, such as Coumadin (Warfarin) are ordered to prevent or treat serious conditions such as "deep thrombosis", "pumonary  embolisms", and other heart problems.  The amount of time that you Jurczyk need off of the medication Lackie also vary with the medication and the reason for which you were taking it.  If you are taking any of these medications, please make sure you notify your pain physician before you undergo any procedures.

## 2017-02-26 NOTE — Progress Notes (Signed)
Safety precautions to be maintained throughout the outpatient stay will include: orient to surroundings, keep bed in low position, maintain call bell within reach at all times, provide assistance with transfer out of bed and ambulation.  

## 2017-02-28 DIAGNOSIS — M160 Bilateral primary osteoarthritis of hip: Secondary | ICD-10-CM | POA: Insufficient documentation

## 2017-02-28 DIAGNOSIS — M5126 Other intervertebral disc displacement, lumbar region: Secondary | ICD-10-CM | POA: Insufficient documentation

## 2017-02-28 DIAGNOSIS — M48061 Spinal stenosis, lumbar region without neurogenic claudication: Secondary | ICD-10-CM | POA: Insufficient documentation

## 2017-02-28 DIAGNOSIS — R937 Abnormal findings on diagnostic imaging of other parts of musculoskeletal system: Secondary | ICD-10-CM | POA: Insufficient documentation

## 2017-02-28 NOTE — Addendum Note (Signed)
Addended by: Delano MetzNAVEIRA, Devra Stare A on: 02/28/2017 05:31 PM   Modules accepted: Level of Service

## 2017-03-07 NOTE — Progress Notes (Signed)
Patient's Name: Chelsea CroftCherise Y [name]  MRN: [id number]  Referring Provider: Jerl MinaHedrick, James, MD  DOB: [birth year]  PCP: Jerl MinaHedrick, James, MD  DOS: 03/08/2017  Note by: Oswaldo DoneFrancisco A Ajah Vanhoose, MD  Service setting: Ambulatory outpatient  Specialty: Interventional Pain Management  Patient type: Established  Location: [facility] (AMB) Pain Management Facility  Visit type: Interventional Procedure   Primary Reason for Visit: Interventional Pain Management Treatment. CC: Back Pain (lower back bilatera) and Leg Pain (bilateral)  Procedure:  Anesthesia, Analgesia, Anxiolysis:  Type: Diagnostic Trans-Foraminal Epidural Steroid Injection Region: Posterolateral Lumbosacral Level: L4-5 and L5-S1 Level Laterality: Bilateral. Right L4 and L5 + left L5  Type: Local Anesthesia with Moderate (Conscious) Sedation Local Anesthetic: Lidocaine 1% Route: Intravenous (IV) IV Access: Secured Sedation: Meaningful verbal contact was maintained at all times during the procedure  Indication(s): Analgesia and Anxiety   Indications: 1. 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 Chronic pain of lower extremity (Primary Area of Pain) (Bilateral) (R>L)   3. L4-5 Lumbar foraminal disc protrusion (Right)   4. 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 Lumbar spondylosis with radiculitis (Bilateral) (L5)   6. Lumbar radiculitis   7. Chronic pain of lower extremity, bilateral   8. Protrusion of lumbar intervertebral disc   9. Lumbar foraminal stenosis   10. Other spondylosis with radiculopathy, lumbar region    Pain Score: Pre-procedure: 3 /10 Post-procedure: 0-No pain/10  Pre-op Assessment:  Chelsea [name] is a 56 y.o. (year old), female patient, seen today for interventional treatment. She  has a past surgical history that includes Abdominal hysterectomy; Tubal ligation; and nose. Chelsea [name] has a current medication list which includes the following prescription(s): acetaminophen, amitriptyline, furosemide, gabapentin, ibuprofen,  lorazepam, losartan, naproxen sodium, omega-3 fatty acids, vitamin b-12, and citalopram, and the following Facility-Administered Medications: fentanyl, midazolam, sodium chloride flush, sodium chloride flush, and sodium chloride flush. Her primarily concern today is the Back Pain (lower back bilatera) and Leg Pain (bilateral)  Initial Vital Signs: There were no vitals taken for this visit. BMI: Estimated body mass index is 40.15 kg/m as calculated from the following:   Height as of this encounter: 5' 0.5" (1.537 m).   Weight as of this encounter: 209 lb (94.8 kg).  Risk Assessment: Allergies: Reviewed. She is allergic to codeine.  Allergy Precautions: None required Coagulopathies: Reviewed. None identified.  Blood-thinner therapy: None at this time Active Infection(s): Reviewed. None identified. Chelsea [name] is afebrile  Site Confirmation: Chelsea [name] was asked to confirm the procedure and laterality before marking the site Procedure checklist: Completed Consent: Before the procedure and under the influence of no sedative(s), amnesic(s), or anxiolytics, the patient was informed of the treatment options, risks and possible complications. To fulfill our ethical and legal obligations, as recommended by the American Medical Association's Code of Ethics, I have informed the patient of my clinical impression; the nature and purpose of the treatment or procedure; the risks, benefits, and possible complications of the intervention; the alternatives, including doing nothing; the risk(s) and benefit(s) of the alternative treatment(s) or procedure(s); and the risk(s) and benefit(s) of doing nothing. The patient was provided information about the general risks and possible complications associated with the procedure. These Chelsea [name] include, but are not limited to: failure to achieve desired goals, infection, bleeding, organ or nerve damage, allergic reactions, paralysis, and death. In addition, the patient was informed of  those risks and complications associated to Spine-related procedures, such as failure to decrease pain; infection (i.e.: Meningitis, epidural or intraspinal abscess);  bleeding (i.e.: epidural hematoma, subarachnoid hemorrhage, or any other type of intraspinal or peri-dural bleeding); organ or nerve damage (i.e.: Any type of peripheral nerve, nerve root, or spinal cord injury) with subsequent damage to sensory, motor, and/or autonomic systems, resulting in permanent pain, numbness, and/or weakness of one or several areas of the body; allergic reactions; (i.e.: anaphylactic reaction); and/or death. Furthermore, the patient was informed of those risks and complications associated with the medications. These include, but are not limited to: allergic reactions (i.e.: anaphylactic or anaphylactoid reaction(s)); adrenal axis suppression; blood sugar elevation that in diabetics Chelsea [name] result in ketoacidosis or comma; water retention that in patients with history of congestive heart failure Chelsea [name] result in shortness of breath, pulmonary edema, and decompensation with resultant heart failure; weight gain; swelling or edema; medication-induced neural toxicity; particulate matter embolism and blood vessel occlusion with resultant organ, and/or nervous system infarction; and/or aseptic necrosis of one or more joints. Finally, the patient was informed that Medicine is not an exact science; therefore, there is also the possibility of unforeseen or unpredictable risks and/or possible complications that Chelsea [name] result in a catastrophic outcome. The patient indicated having understood very clearly. We have given the patient no guarantees and we have made no promises. Enough time was given to the patient to ask questions, all of which were answered to the patient's satisfaction. Chelsea [name] has indicated that she wanted to continue with the procedure. Attestation: I, the ordering provider, attest that I have discussed with the patient the  benefits, risks, side-effects, alternatives, likelihood of achieving goals, and potential problems during recovery for the procedure that I have provided informed consent. Date: 03/08/2017; Time: 4:36 PM  Pre-Procedure Preparation:  Monitoring: As per clinic protocol. Respiration, ETCO2, SpO2, BP, heart rate and rhythm monitor placed and checked for adequate function Safety Precautions: Patient was assessed for positional comfort and pressure points before starting the procedure. Time-out: I initiated and conducted the "Time-out" before starting the procedure, as per protocol. The patient was asked to participate by confirming the accuracy of the "Time Out" information. Verification of the correct person, site, and procedure were performed and confirmed by me, the nursing staff, and the patient. "Time-out" conducted as per Joint Commission's Universal Protocol (UP.01.01.01). "Time-out" Date & Time: 03/08/2017; 0846 hrs.  Description of Procedure Process:   Position: Prone Target Area: For Epidural Steroid injection(s), the target area is the  interlaminar space, initially targeting the lower border of the superior vertebral body lamina. Approach: Paravertebral approach. Area Prepped: Entire Posterior Lumbosacral Area Prepping solution: ChloraPrep (2% chlorhexidine gluconate and 70% isopropyl alcohol) Safety Precautions: Aspiration looking for blood return was conducted prior to all injections. At no point did we inject any substances, as a needle was being advanced. No attempts were made at seeking any paresthesias. Safe injection practices and needle disposal techniques used. Medications properly checked for expiration dates. SDV (single dose vial) medications used. Description of the Procedure: Protocol guidelines were followed. The patient was placed in position over the procedure table. The target area was identified and the area prepped in the usual manner. Skin desensitized using vapocoolant  spray. Skin & deeper tissues infiltrated with local anesthetic. Appropriate amount of time allowed to pass for local anesthetics to take effect. The procedure needles were then advanced to the target area. Proper needle placement secured. Negative aspiration confirmed. Solution injected in intermittent fashion, asking for systemic symptoms every 0.5cc of injectate. The needles were then removed and the area cleansed, making sure to leave some  of the prepping solution back to take advantage of its long term bactericidal properties. Vitals:   03/08/17 0909 03/08/17 0919 03/08/17 0929 03/08/17 0939  BP: 127/77 139/79 133/75 126/80  Pulse: 81 80 80 80  Resp: 11 11 16 16   Temp:  98.2 F (36.8 C)    TempSrc:      SpO2: 94% 92% 98% 99%  Weight:      Height:        Start Time: 0846 hrs. End Time: 0908 hrs. Materials:  Needle(s) Type: Regular needle Gauge: 22G Length: 3.5-in Medication(s): We administered lactated ringers, midazolam, fentaNYL, lidocaine, iopamidol, dexamethasone, ropivacaine (PF) 2 mg/mL (0.2%), dexamethasone, ropivacaine (PF) 2 mg/mL (0.2%), dexamethasone, and ropivacaine (PF) 2 mg/mL (0.2%). Please see chart orders for dosing details.  Imaging Guidance (Spinal):  Type of Imaging Technique: Fluoroscopy Guidance (Spinal) Indication(s): Assistance in needle guidance and placement for procedures requiring needle placement in or near specific anatomical locations not easily accessible without such assistance. Exposure Time: Please see nurses notes. Contrast: Before injecting any contrast, we confirmed that the patient did not have an allergy to iodine, shellfish, or radiological contrast. Once satisfactory needle placement was completed at the desired level, radiological contrast was injected. Contrast injected under live fluoroscopy. No contrast complications. See chart for type and volume of contrast used. Fluoroscopic Guidance: I was personally present during the use of fluoroscopy.  "Tunnel Vision Technique" used to obtain the best possible view of the target area. Parallax error corrected before commencing the procedure. "Direction-depth-direction" technique used to introduce the needle under continuous pulsed fluoroscopy. Once target was reached, antero-posterior, oblique, and lateral fluoroscopic projection used confirm needle placement in all planes. Images permanently stored in EMR. Interpretation: I personally interpreted the imaging intraoperatively. Adequate needle placement confirmed in multiple planes. Appropriate spread of contrast into desired area was observed. No evidence of afferent or efferent intravascular uptake. No intrathecal or subarachnoid spread observed. Permanent images saved into the patient's record. Evidence would suggest that at the L45 and L5-S1 level today into her transverse ligament is calcified, especially on the right side making it very difficult to axis desired area.  Antibiotic Prophylaxis:  Indication(s): None identified Antibiotic given: None  Post-operative Assessment:  EBL: None Complications: No immediate post-treatment complications observed by team, or reported by patient. Note: The patient tolerated the entire procedure well. A repeat set of vitals were taken after the procedure and the patient was kept under observation following institutional policy, for this type of procedure. Post-procedural neurological assessment was performed, showing return to baseline, prior to discharge. The patient was provided with post-procedure discharge instructions, including a section on how to identify potential problems. Should any problems arise concerning this procedure, the patient was given instructions to immediately contact us, at any time, without hesitation. In any case, we plan to contact the patient by telephone for a follow-up status report regarding this interventional procedure. Comments:  No additional relevant information.  Plan of Care     Imaging Orders     DG C-Arm 1-60 Min-No Report  Procedure Orders     Lumbar Transforaminal Epidural  Medications ordered for procedure: Meds ordered this encounter  Medications  . lactated ringers infusion 1,000 mL  . midazolam (VERSED) 5 MG/5ML injection 1-2 mg    Make sure Flumazenil is available in the pyxis when using this medication. If oversedation occurs, administer 0.2 mg IV over 15 sec. If after 45 sec no response, administer 0.2 mg again over 1 min; Rucinski repeat at  1 min intervals; not to exceed 4 doses (1 mg)  . fentaNYL (SUBLIMAZE) injection 25-50 mcg    Make sure Narcan is available in the pyxis when using this medication. In the event of respiratory depression (RR< 8/min): Titrate NARCAN (naloxone) in increments of 0.1 to 0.2 mg IV at 2-3 minute intervals, until desired degree of reversal.  . lidocaine (XYLOCAINE) 2 % (with pres) injection 200 mg  . iopamidol (ISOVUE-M) 41 % intrathecal injection 10 mL  . dexamethasone (DECADRON) injection 10 mg  . ropivacaine (PF) 2 mg/mL (0.2%) (NAROPIN) injection 1 mL  . sodium chloride flush (NS) 0.9 % injection 1 mL  . dexamethasone (DECADRON) injection 10 mg  . ropivacaine (PF) 2 mg/mL (0.2%) (NAROPIN) injection 1 mL  . sodium chloride flush (NS) 0.9 % injection 1 mL  . dexamethasone (DECADRON) injection 10 mg  . ropivacaine (PF) 2 mg/mL (0.2%) (NAROPIN) injection 1 mL  . sodium chloride flush (NS) 0.9 % injection 1 mL   Medications administered: We administered lactated ringers, midazolam, fentaNYL, lidocaine, iopamidol, dexamethasone, ropivacaine (PF) 2 mg/mL (0.2%), dexamethasone, ropivacaine (PF) 2 mg/mL (0.2%), dexamethasone, and ropivacaine (PF) 2 mg/mL (0.2%).  See the medical record for exact dosing, route, and time of administration.  This SmartLink is deprecated. Use AVSMEDLIST instead to display the medication list for a patient. Disposition: Discharge home  Discharge Date & Time: 03/08/2017; 0942 hrs.    Physician-requested Follow-up: Return for post-procedure eval by Dr. Laban Emperor in 2 wks. Future Appointments  Date Time Provider Department Center  03/21/2017  8:30 AM Delano Metz, MD [facility] None   Primary Care Physician: Jerl Mina, MD Location: [facility] Note by: Oswaldo Done, MD Date: 03/08/2017; Time: 10:29 AM  Disclaimer:  Medicine is not an exact science. The only guarantee in medicine is that nothing is guaranteed. It is important to note that the decision to proceed with this intervention was based on the information collected from the patient. The Data and conclusions were drawn from the patient's questionnaire, the interview, and the physical examination. Because the information was provided in large part by the patient, it cannot be guaranteed that it has not been purposely or unconsciously manipulated. Every effort has been made to obtain as much relevant data as possible for this evaluation. It is important to note that the conclusions that lead to this procedure are derived in large part from the available data. Always take into account that the treatment will also be dependent on availability of resources and existing treatment guidelines, considered by other Pain Management Practitioners as being common knowledge and practice, at the time of the intervention. For Medico-Legal purposes, it is also important to point out that variation in procedural techniques and pharmacological choices are the acceptable norm. The indications, contraindications, technique, and results of the above procedure should only be interpreted and judged by a Board-Certified Interventional Pain Specialist with extensive familiarity and expertise in the same exact procedure and technique.

## 2017-03-08 ENCOUNTER — Ambulatory Visit
Admission: RE | Admit: 2017-03-08 | Discharge: 2017-03-08 | Disposition: A | Discharge status: Home / Self Care | Payer: BLUE CROSS/BLUE SHIELD | Source: Ambulatory Visit | Attending: Pain Medicine | Admitting: Pain Medicine

## 2017-03-08 ENCOUNTER — Ambulatory Visit (HOSPITAL_BASED_OUTPATIENT_CLINIC_OR_DEPARTMENT_OTHER): Payer: BLUE CROSS/BLUE SHIELD | Admitting: Pain Medicine

## 2017-03-08 ENCOUNTER — Encounter: Payer: Self-pay | Admitting: Pain Medicine

## 2017-03-08 VITALS — BP 126/80 | HR 80 | Temp 98.2°F | Resp 16 | Ht 60.5 in | Wt 209.0 lb

## 2017-03-08 DIAGNOSIS — M79605 Pain in left leg: Secondary | ICD-10-CM | POA: Insufficient documentation

## 2017-03-08 DIAGNOSIS — M5116 Intervertebral disc disorders with radiculopathy, lumbar region: Secondary | ICD-10-CM | POA: Insufficient documentation

## 2017-03-08 DIAGNOSIS — M9983 Other biomechanical lesions of lumbar region: Secondary | ICD-10-CM | POA: Diagnosis not present

## 2017-03-08 DIAGNOSIS — M4726 Other spondylosis with radiculopathy, lumbar region: Secondary | ICD-10-CM | POA: Diagnosis not present

## 2017-03-08 DIAGNOSIS — M5126 Other intervertebral disc displacement, lumbar region: Secondary | ICD-10-CM

## 2017-03-08 DIAGNOSIS — M48061 Spinal stenosis, lumbar region without neurogenic claudication: Secondary | ICD-10-CM | POA: Diagnosis not present

## 2017-03-08 DIAGNOSIS — M5416 Radiculopathy, lumbar region: Secondary | ICD-10-CM | POA: Diagnosis not present

## 2017-03-08 DIAGNOSIS — M79604 Pain in right leg: Secondary | ICD-10-CM | POA: Diagnosis present

## 2017-03-08 DIAGNOSIS — G8929 Other chronic pain: Secondary | ICD-10-CM | POA: Diagnosis present

## 2017-03-08 MED ORDER — ROPIVACAINE HCL 2 MG/ML IJ SOLN
1.0000 mL | Freq: Once | INTRAMUSCULAR | Status: AC
  Administered 2017-03-08: 10 mL via EPIDURAL
  Filled 2017-03-08: qty 10

## 2017-03-08 MED ORDER — IOPAMIDOL (ISOVUE-M 200) INJECTION 41%
10.0000 mL | Freq: Once | INTRAMUSCULAR | Status: AC
  Administered 2017-03-08: 10 mL via EPIDURAL
  Filled 2017-03-08: qty 10

## 2017-03-08 MED ORDER — DEXAMETHASONE SODIUM PHOSPHATE 10 MG/ML IJ SOLN
10.0000 mg | Freq: Once | INTRAMUSCULAR | Status: AC
  Administered 2017-03-08: 10 mg
  Filled 2017-03-08: qty 1

## 2017-03-08 MED ORDER — FENTANYL CITRATE (PF) 100 MCG/2ML IJ SOLN
25.0000 ug | INTRAMUSCULAR | Status: DC | PRN
  Administered 2017-03-08: 100 ug via INTRAVENOUS
  Filled 2017-03-08: qty 2

## 2017-03-08 MED ORDER — MIDAZOLAM HCL 5 MG/5ML IJ SOLN
1.0000 mg | INTRAMUSCULAR | Status: DC | PRN
  Administered 2017-03-08: 3 mg via INTRAVENOUS
  Filled 2017-03-08: qty 5

## 2017-03-08 MED ORDER — SODIUM CHLORIDE 0.9% FLUSH: 1.0000 mL | Freq: Once | INTRAVENOUS | Status: DC

## 2017-03-08 MED ORDER — LACTATED RINGERS IV SOLN
1000.0000 mL | Freq: Once | INTRAVENOUS | Status: AC
  Administered 2017-03-08: 1000 mL via INTRAVENOUS

## 2017-03-08 MED ORDER — LIDOCAINE HCL 2 % IJ SOLN
10.0000 mL | Freq: Once | INTRAMUSCULAR | Status: AC
  Administered 2017-03-08: 400 mg
  Filled 2017-03-08: qty 20

## 2017-03-08 NOTE — Patient Instructions (Signed)
____________________________________________________________________________________________  Post-Procedure instructions Instructions:  Apply ice: Fill a plastic sandwich bag with crushed ice. Cover it with a small towel and apply to injection site. Apply for 15 minutes then remove x 15 minutes. Repeat sequence on day of procedure, until you go to bed. The purpose is to minimize swelling and discomfort after procedure.  Apply heat: Apply heat to procedure site starting the day following the procedure. The purpose is to treat any soreness and discomfort from the procedure.  Food intake: Start with clear liquids (like water) and advance to regular food, as tolerated.   Physical activities: Keep activities to a minimum for the first 8 hours after the procedure.   Driving: If you have received any sedation, you are not allowed to drive for 24 hours after your procedure.  Blood thinner: Restart your blood thinner 6 hours after your procedure. (Only for those taking blood thinners)  Insulin: As soon as you can eat, you Belle resume your normal dosing schedule. (Only for those taking insulin)  Infection prevention: Keep procedure site clean and dry.  Post-procedure Pain Diary: Extremely important that this be done correctly and accurately. Recorded information will be used to determine the next step in treatment.  Pain evaluated is that of treated area only. Do not include pain from an untreated area.  Complete every hour, on the hour, for the initial 8 hours. Set an alarm to help you do this part accurately.  Do not go to sleep and have it completed later. It will not be accurate.  Follow-up appointment: Keep your follow-up appointment after the procedure. Usually 2 weeks for most procedures. (6 weeks in the case of radiofrequency.) Bring you pain diary.  Expect:  From numbing medicine (AKA: Local Anesthetics): Numbness or decrease in pain.  Onset: Full effect within 15 minutes of  injected.  Duration: It will depend on the type of local anesthetic used. On the average, 1 to 8 hours.   From steroids: Decrease in swelling or inflammation. Once inflammation is improved, relief of the pain will follow.  Onset of benefits: Depends on the amount of swelling present. The more swelling, the longer it will take for the benefits to be seen. In some cases, up to 10 days.  Duration: Steroids will stay in the system x 2 weeks. Duration of benefits will depend on multiple posibilities including persistent irritating factors.  From procedure: Some discomfort is to be expected once the numbing medicine wears off. This should be minimal if ice and heat are applied as instructed. Call if:  You experience numbness and weakness that gets worse with time, as opposed to wearing off.  New onset bowel or bladder incontinence. (Spinal procedures only)  Emergency Numbers:  Durning business hours (Monday - Thursday, 8:00 AM - 4:00 PM) (Friday, 9:00 AM - 12:00 Noon): [phone]  After hours: [phone] ____________________________________________________________________________________________    

## 2017-03-09 ENCOUNTER — Telehealth: Payer: Self-pay

## 2017-03-09 NOTE — Telephone Encounter (Signed)
Post procedure phone call.  States she is doing good.  

## 2017-03-20 NOTE — Progress Notes (Signed)
Patient's Name: Chelsea [name]  MRN: [id number]  Referring Provider: Maryland Pink, MD  DOB: 1961-01-04  PCP: Maryland Pink, MD  DOS: 03/21/2017  Note by: Gaspar Cola, MD  Service setting: Ambulatory outpatient  Specialty: Interventional Pain Management  Location: [facility] (AMB) Pain Management Facility    Patient type: Established   Primary Reason(s) for Visit: Encounter for post-procedure evaluation of chronic illness with mild to moderate exacerbation CC: Back Pain (lower)  HPI  Chelsea [name] is a 56 y.o. year old, female patient, who comes today for a post-procedure evaluation. 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 Her primarily concern today is the Back Pain (lower)  Pain Assessment: Location: Lower Back Radiating: hips down back of legs to feet bilateral Duration: Chronic pain Quality: Aching, Sharp, Tingling, Burning, Numbness Severity: 2 /10 (self-reported pain score)  Note:  Reported level is compatible with observation.                         When using our objective Pain Scale, levels between 6 and 10/10 are said to belong in an emergency room, as it progressively worsens from a 6/10, described as severely limiting, requiring emergency care not usually available at an outpatient pain management facility. At a 6/10 level, communication becomes difficult and requires great effort. Assistance to reach the emergency department [place] be required. Facial flushing and profuse sweating along with potentially dangerous increases in heart rate and blood pressure will be evident. Effect on ADL: prolonged and short periods of walking, bending twisting Timing: Constant Modifying factors: procedures, heat, rest  Chelsea [name] comes in today for post-procedure evaluation after the treatment done on 03/08/2017.  Further details on both, my assessment(s), as well as the proposed treatment plan, please see below.  Post-Procedure Assessment  03/08/2017 Procedure: Diagnostic Right L4 and L5 + left L5 transforaminal epidural steroid injection under fluoroscopic guidance, and IV sedation Pre-procedure pain score:  3/10 Post-procedure pain score: 0/10 (100% relief) Influential Factors: BMI: 38.23 kg/m Intra-procedural challenges: None observed.         Assessment challenges: None detected.              Reported side-effects: None.        Post-procedural adverse reactions or complications: None reported         Sedation: Sedation provided. When no sedatives are used, the analgesic levels obtained are directly associated to the effectiveness of the local anesthetics. However, when sedation is provided, the level of analgesia obtained during the initial 1 hour following the intervention, is believed to be the result of a combination of factors. These factors Schlegel include, but are not limited to: 1. The effectiveness of the local anesthetics used. 2. The effects of the analgesic(s) and/or  anxiolytic(s) used. 3. The degree of discomfort experienced by the patient at the time of the procedure. 4. The patients ability and reliability in recalling and recording the events. 5. The presence and influence of possible secondary gains and/or psychosocial factors. Reported result: Relief experienced during the 1st hour after the procedure: 100 % (Ultra-Short Term Relief)            Interpretative annotation: Clinically appropriate result. Analgesia during this period is likely to be Local Anesthetic and/or IV Sedative (Analgesic/Anxiolytic) related.          Effects of local anesthetic: The analgesic effects attained during this period are directly associated to the localized infiltration of local anesthetics and therefore cary significant diagnostic value as to the etiological location, or anatomical origin, of the pain. Expected duration of relief is directly dependent on the pharmacodynamics of the local anesthetic used. Long-acting (4-6 hours) anesthetics used.  Reported result: Relief during the next 4 to 6 hour after the procedure: 100 % (Short-Term Relief)            Interpretative annotation: Clinically appropriate result. Analgesia during this period is likely to be Local Anesthetic-related.          Long-term benefit: Defined as the period of time past the expected duration of local anesthetics (1 hour for short-acting and 4-6 hours for long-acting). With the possible exception of prolonged sympathetic blockade from the local anesthetics, benefits during this period are typically attributed to, or associated with, other factors such as analgesic sensory neuropraxia, antiinflammatory effects, or beneficial biochemical changes provided by agents other than the local anesthetics.  Reported result: Extended relief following procedure: 50 %(1 week) (Long-Term Relief)            Interpretative annotation: Clinically appropriate result. Good relief. No permanent benefit expected. Inflammation  plays a part in the etiology to the pain.          Current benefits: Defined as reported results that persistent at this point in time.   Analgesia: 25-50 %            Function: Somewhat improved ROM: Somewhat improved Interpretative annotation: Recurrence of symptoms. No permanent benefit expected. Effective diagnostic intervention.          Interpretation: Results would suggest a successful diagnostic intervention.                  Plan:  Proceed with treatment No.: 2  Laboratory Chemistry  Inflammation Markers (CRP: Acute Phase) (ESR: Chronic Phase) Lab Results  Component Value Date   CRP 2.5 02/06/2017   ESRSEDRATE 15 02/06/2017                 Rheumatology Markers No results found for: RF, ANA, Chelsea [name], [facility]              Renal Function Markers Lab Results  Component Value Date   BUN 15 02/06/2017   CREATININE 0.92 02/06/2017   GFRAA 82 02/06/2017   GFRNONAA 71 02/06/2017                 Hepatic Function Markers Lab Results  Component Value Date   AST 22 02/06/2017   ALBUMIN 4.3 02/06/2017   ALKPHOS 80 02/06/2017                 Electrolytes Lab Results  Component Value Date   NA 135 02/06/2017   K 4.2 02/06/2017   CL 95 (L) 02/06/2017   CALCIUM 9.7 02/06/2017  MG 1.9 02/06/2017                 Neuropathy Markers Lab Results  Component Value Date   VITAMINB12 1,523 (H) 02/06/2017                 Bone Pathology Markers Lab Results  Component Value Date   25OHVITD1 32 02/06/2017   25OHVITD2 1.5 02/06/2017   25OHVITD3 30 02/06/2017                 Coagulation Parameters Lab Results  Component Value Date   INR 0.9 04/30/2011   LABPROT 12.6 04/30/2011   APTT 28.7 04/30/2011   PLT 381 04/30/2011                 Cardiovascular Markers Lab Results  Component Value Date   HGB 14.3 04/30/2011   HCT 40.7 04/30/2011                 CA Markers No results found for: CEA, CA125, LABCA2               Note: Lab  results reviewed.  Recent Diagnostic Imaging Results  DG C-Arm 1-60 Min-No Report Fluoroscopy was utilized by the requesting physician.  No radiographic  interpretation.   Complexity Note: Imaging results reviewed. Results shared with Chelsea [name], using Layman's terms.                         Meds   Current Outpatient Medications:  .  acetaminophen (TYLENOL) 500 MG tablet, Take by mouth., Disp: , Rfl:  .  amitriptyline (ELAVIL) 10 MG tablet, TAKE 1 TO 2 TABLETS BY MOUTH AT BEDTIME, Disp: , Rfl:  .  citalopram (CELEXA) 20 MG tablet, Take 20 mg daily by mouth., Disp: , Rfl: 3 .  furosemide (LASIX) 20 MG tablet, Take by mouth., Disp: , Rfl:  .  gabapentin (NEURONTIN) 100 MG capsule, TAKE 1 CAPSULE BY MOUTH 3 TIMES A DAY . CAN TITRATE UP TO 3 CAPSULES (300MG) 3 TIMES A DAY ., Disp: , Rfl:  .  ibuprofen (ADVIL,MOTRIN) 200 MG tablet, Take by mouth., Disp: , Rfl:  .  LORazepam (ATIVAN) 0.5 MG tablet, Take by mouth., Disp: , Rfl:  .  losartan (COZAAR) 100 MG tablet, Take 100 mg daily by mouth., Disp: , Rfl: 10 .  naproxen sodium (ALEVE) 220 MG tablet, Take by mouth., Disp: , Rfl:  .  Omega-3 Fatty Acids (FISH OIL PO), Take by mouth., Disp: , Rfl:  .  vitamin B-12 (CYANOCOBALAMIN) 1000 MCG tablet, Take by mouth., Disp: , Rfl:   ROS  Constitutional: Denies any fever or chills Gastrointestinal: No reported hemesis, hematochezia, vomiting, or acute GI distress Musculoskeletal: Denies any acute onset joint swelling, redness, loss of ROM, or weakness Neurological: No reported episodes of acute onset apraxia, aphasia, dysarthria, agnosia, amnesia, paralysis, loss of coordination, or loss of consciousness  Allergies  Chelsea [name] is allergic to codeine.  PFSH  Drug: Chelsea [name]  reports that she does not use drugs. Alcohol:  reports that she drinks alcohol. Tobacco:  reports that she has been smoking cigarettes.  She has a 39.00 pack-year smoking history. she has never used smokeless tobacco. Medical:  has  a past medical history of Allergy, Anxiety, Depression, Hyperlipidemia, Hypertension, Sleep apnea, Vitamin B12 deficiency, and Vitamin D deficiency. Surgical: Chelsea [name]  has a past surgical history that includes Abdominal hysterectomy; Tubal ligation; and nose. Family: family history  includes Cancer in her father.  Constitutional Exam  General appearance: Well nourished, well developed, and well hydrated. In no apparent acute distress Vitals:   03/21/17 0811  BP: (!) 183/105  Pulse: 91  Resp: 18  Temp: 98.4 F (36.9 C)  TempSrc: Oral  SpO2: 100%  Weight: 209 lb (94.8 kg)  Height: _0  (1.575 m)   BMI Assessment: Estimated body mass index is 38.23 kg/m as calculated from the following:   Height as of this encounter: _1  (1.575 m).   Weight as of this encounter: 209 lb (94.8 kg).  BMI interpretation table: BMI level Category Range association with higher incidence of chronic pain  <18 kg/m2 Underweight   18.5-24.9 kg/m2 Ideal body weight   25-29.9 kg/m2 Overweight Increased incidence by 20%  30-34.9 kg/m2 Obese (Class I) Increased incidence by 68%  35-39.9 kg/m2 Severe obesity (Class II) Increased incidence by 136%  >40 kg/m2 Extreme obesity (Class III) Increased incidence by 254%   BMI Readings from Last 4 Encounters:  03/21/17 38.23 kg/m  03/08/17 40.15 kg/m  02/26/17 36.31 kg/m  02/06/17 36.31 kg/m   Wt Readings from Last 4 Encounters:  03/21/17 209 lb (94.8 kg)  03/08/17 209 lb (94.8 kg)  02/26/17 205 lb (93 kg)  02/06/17 205 lb (93 kg)  Psych/Mental status: Alert, oriented x 3 (person, place, & time)       Eyes: PERLA Respiratory: No evidence of acute respiratory distress  Cervical Spine Area Exam  Skin & Axial Inspection: No masses, redness, edema, swelling, or associated skin lesions Alignment: Symmetrical Functional ROM: Unrestricted ROM      Stability: No instability detected Muscle Tone/Strength: Functionally intact. No obvious neuro-muscular anomalies  detected. Sensory (Neurological): Unimpaired Palpation: No palpable anomalies              Upper Extremity (UE) Exam    Side: Right upper extremity  Side: Left upper extremity  Skin & Extremity Inspection: Skin color, temperature, and hair growth are WNL. No peripheral edema or cyanosis. No masses, redness, swelling, asymmetry, or associated skin lesions. No contractures.  Skin & Extremity Inspection: Skin color, temperature, and hair growth are WNL. No peripheral edema or cyanosis. No masses, redness, swelling, asymmetry, or associated skin lesions. No contractures.  Functional ROM: Unrestricted ROM          Functional ROM: Unrestricted ROM          Muscle Tone/Strength: Functionally intact. No obvious neuro-muscular anomalies detected.  Muscle Tone/Strength: Functionally intact. No obvious neuro-muscular anomalies detected.  Sensory (Neurological): Unimpaired          Sensory (Neurological): Unimpaired          Palpation: No palpable anomalies              Palpation: No palpable anomalies              Specialized Test(s): Deferred         Specialized Test(s): Deferred          Thoracic Spine Area Exam  Skin & Axial Inspection: No masses, redness, or swelling Alignment: Symmetrical Functional ROM: Unrestricted ROM Stability: No instability detected Muscle Tone/Strength: Functionally intact. No obvious neuro-muscular anomalies detected. Sensory (Neurological): Unimpaired Muscle strength & Tone: No palpable anomalies  Lumbar Spine Area Exam  Skin & Axial Inspection: No masses, redness, or swelling Alignment: Symmetrical Functional ROM: Unrestricted ROM      Stability: No instability detected Muscle Tone/Strength: Functionally intact. No obvious neuro-muscular anomalies detected. Sensory (Neurological): Unimpaired  Palpation: No palpable anomalies       Provocative Tests: Lumbar Hyperextension and rotation test: evaluation deferred today       Lumbar Lateral bending test: evaluation  deferred today       Patrick's Maneuver: evaluation deferred today                    Gait & Posture Assessment  Ambulation: Unassisted Gait: Relatively normal for age and body habitus Posture: WNL   Lower Extremity Exam    Side: Right lower extremity  Side: Left lower extremity  Skin & Extremity Inspection: Skin color, temperature, and hair growth are WNL. No peripheral edema or cyanosis. No masses, redness, swelling, asymmetry, or associated skin lesions. No contractures.  Skin & Extremity Inspection: Skin color, temperature, and hair growth are WNL. No peripheral edema or cyanosis. No masses, redness, swelling, asymmetry, or associated skin lesions. No contractures.  Functional ROM: Unrestricted ROM          Functional ROM: Unrestricted ROM          Muscle Tone/Strength: Functionally intact. No obvious neuro-muscular anomalies detected.  Muscle Tone/Strength: Functionally intact. No obvious neuro-muscular anomalies detected.  Sensory (Neurological): Unimpaired  Sensory (Neurological): Unimpaired  Palpation: No palpable anomalies  Palpation: No palpable anomalies   Assessment  Primary Diagnosis & Pertinent Problem List: The primary encounter diagnosis was Chronic pain of lower extremity (Primary Area of Pain) (Bilateral) (R>L). Diagnoses of Chronic L5 Lumbar radiculitis (Bilateral) (R>L) and L4-5 Lumbar foraminal disc protrusion (Right) were also pertinent to this visit.  Status Diagnosis  Controlled Controlled Controlled 1. Chronic pain of lower extremity (Primary Area of Pain) (Bilateral) (R>L)   2. Chronic L5 Lumbar radiculitis (Bilateral) (R>L)   3. 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 Plan of Care  Pharmacotherapy (Medications Ordered): No orders of the defined types were placed in this encounter.  Medications administered today: Chelsea [name] had no medications administered during this  visit.   Procedure Orders     Lumbar Transforaminal Epidural Lab Orders  No laboratory test(s) ordered today   Imaging Orders  No imaging studies ordered today   Referral Orders  No referral(s) requested today    Interventional management options: Planned, scheduled, and/or pending:   Diagnostic Right L4 and L5 + left L5 transforaminal epidural steroid injection #2  under fluoroscopic guidance, and IV sedation   Considering:   Diagnostic Lidocaine infusion to evaluate the idiopathic small fiber peripheral neuropathy, and to see if she Degregorio be a good candidate for mexiletine. Diagnostic bilateral lumbar sympathetic blocks  Possible bilateral lumbar facet RFA  Diagnostic bilateral lumbar facet nerve block  Possible bilateral lumbar facet RFA Diagnostic bilateral sacroiliac joint block  Possible bilateral sacroiliac joint RFA  Diagnostic bilateral intra-articular hip joint injection  Diagnostic bilateral femoral nerve + obturator nerve block  Possible bilateral femoral nerve + obturator nerve RFA Possible spinal cord stimulator trial    Palliative PRN treatment(s):   None at this time   Provider-requested follow-up: Return for Procedure (w/ sedation): (B) L-TFESI (Right L4 and L5 + left L5).  Future Appointments  Date Time Provider [place]  04/12/2017  9:30 AM Milinda Pointer, MD [facility] None   Primary Care Physician: Maryland Pink, MD Location: [facility] Note by: Gaspar Cola, MD Date: 03/21/2017; Time: 7:13 PM

## 2017-03-21 ENCOUNTER — Other Ambulatory Visit: Payer: Self-pay

## 2017-03-21 ENCOUNTER — Encounter: Payer: Self-pay | Admitting: Pain Medicine

## 2017-03-21 ENCOUNTER — Ambulatory Visit: Payer: BLUE CROSS/BLUE SHIELD | Attending: Pain Medicine | Admitting: Pain Medicine

## 2017-03-21 VITALS — BP 183/105 | HR 91 | Temp 98.4°F | Resp 18 | Ht 62.0 in | Wt 209.0 lb

## 2017-03-21 DIAGNOSIS — M25572 Pain in left ankle and joints of left foot: Secondary | ICD-10-CM | POA: Diagnosis not present

## 2017-03-21 DIAGNOSIS — M533 Sacrococcygeal disorders, not elsewhere classified: Secondary | ICD-10-CM | POA: Insufficient documentation

## 2017-03-21 DIAGNOSIS — F419 Anxiety disorder, unspecified: Secondary | ICD-10-CM | POA: Insufficient documentation

## 2017-03-21 DIAGNOSIS — M160 Bilateral primary osteoarthritis of hip: Secondary | ICD-10-CM | POA: Diagnosis not present

## 2017-03-21 DIAGNOSIS — I10 Essential (primary) hypertension: Secondary | ICD-10-CM | POA: Diagnosis not present

## 2017-03-21 DIAGNOSIS — M545 Low back pain: Secondary | ICD-10-CM | POA: Diagnosis present

## 2017-03-21 DIAGNOSIS — M25552 Pain in left hip: Secondary | ICD-10-CM | POA: Insufficient documentation

## 2017-03-21 DIAGNOSIS — M48061 Spinal stenosis, lumbar region without neurogenic claudication: Secondary | ICD-10-CM | POA: Diagnosis not present

## 2017-03-21 DIAGNOSIS — E559 Vitamin D deficiency, unspecified: Secondary | ICD-10-CM | POA: Diagnosis not present

## 2017-03-21 DIAGNOSIS — M25551 Pain in right hip: Secondary | ICD-10-CM | POA: Diagnosis not present

## 2017-03-21 DIAGNOSIS — M5416 Radiculopathy, lumbar region: Secondary | ICD-10-CM | POA: Diagnosis not present

## 2017-03-21 DIAGNOSIS — G8929 Other chronic pain: Secondary | ICD-10-CM

## 2017-03-21 DIAGNOSIS — G894 Chronic pain syndrome: Secondary | ICD-10-CM | POA: Diagnosis not present

## 2017-03-21 DIAGNOSIS — M79604 Pain in right leg: Secondary | ICD-10-CM | POA: Insufficient documentation

## 2017-03-21 DIAGNOSIS — M5126 Other intervertebral disc displacement, lumbar region: Secondary | ICD-10-CM

## 2017-03-21 DIAGNOSIS — F329 Major depressive disorder, single episode, unspecified: Secondary | ICD-10-CM | POA: Insufficient documentation

## 2017-03-21 DIAGNOSIS — F17210 Nicotine dependence, cigarettes, uncomplicated: Secondary | ICD-10-CM | POA: Diagnosis not present

## 2017-03-21 DIAGNOSIS — Z79899 Other long term (current) drug therapy: Secondary | ICD-10-CM | POA: Insufficient documentation

## 2017-03-21 DIAGNOSIS — G4733 Obstructive sleep apnea (adult) (pediatric): Secondary | ICD-10-CM | POA: Diagnosis not present

## 2017-03-21 DIAGNOSIS — M4726 Other spondylosis with radiculopathy, lumbar region: Secondary | ICD-10-CM | POA: Insufficient documentation

## 2017-03-21 DIAGNOSIS — E782 Mixed hyperlipidemia: Secondary | ICD-10-CM | POA: Insufficient documentation

## 2017-03-21 DIAGNOSIS — M5116 Intervertebral disc disorders with radiculopathy, lumbar region: Secondary | ICD-10-CM | POA: Diagnosis not present

## 2017-03-21 DIAGNOSIS — M79605 Pain in left leg: Secondary | ICD-10-CM | POA: Insufficient documentation

## 2017-03-21 DIAGNOSIS — M25571 Pain in right ankle and joints of right foot: Secondary | ICD-10-CM | POA: Insufficient documentation

## 2017-03-21 NOTE — Progress Notes (Signed)
Safety precautions to be maintained throughout the outpatient stay will include: orient to surroundings, keep bed in low position, maintain call bell within reach at all times, provide assistance with transfer out of bed and ambulation.  

## 2017-03-21 NOTE — Patient Instructions (Addendum)
____________________________________________________________________________________________  Preparing for Procedure with Sedation Instructions: . Oral Intake: Do not eat or drink anything for at least 8 hours prior to your procedure. . Transportation: Public transportation is not allowed. Bring an adult driver. The driver must be physically present in our waiting room before any procedure can be started. Marland Kitchen Physical Assistance: Bring an adult physically capable of assisting you, in the event you need help. This adult should keep you company at home for at least 6 hours after the procedure. . Blood Pressure Medicine: Take your blood pressure medicine with a sip of water the morning of the procedure. . Blood thinners:  . Diabetics on insulin: Notify the staff so that you can be scheduled 1st case in the morning. If your diabetes requires high dose insulin, take only  of your normal insulin dose the morning of the procedure and notify the staff that you have done so. . Preventing infections: Shower with an antibacterial soap the morning of your procedure. . Build-up your immune system: Take 1000 mg of Vitamin C with every meal (3 times a day) the day prior to your procedure. Marland Kitchen Antibiotics: Inform the staff if you have a condition or reason that requires you to take antibiotics before dental procedures. . Pregnancy: If you are pregnant, call and cancel the procedure. . Sickness: If you have a cold, fever, or any active infections, call and cancel the procedure. . Arrival: You must be in the facility at least 30 minutes prior to your scheduled procedure. . Children: Do not bring children with you. . Dress appropriately: Bring dark clothing that you would not mind if they get stained. . Valuables: Do not bring any jewelry or valuables. Procedure appointments are reserved for interventional treatments only. Marland Kitchen No Prescription Refills. . No medication changes will be discussed during procedure  appointments. . No disability issues will be discussed. ____________________________________________________________________________________________   Selective Nerve Root Block Patient Information  Description: Specific nerve roots exit the spinal canal and these nerves can be compressed and inflamed by a bulging disc and bone spurs.  By injecting steroids on the nerve root, we can potentially decrease the inflammation surrounding these nerves, which often leads to decreased pain.  Also, by injecting local anesthesia on the nerve root, this can provide [place] helpful information to give to your referring doctor if it decreases your pain.  Selective nerve root blocks can be done along the spine from the neck to the low back depending on the location of your pain.   After numbing the skin with local anesthesia, a small needle is passed to the nerve root and the position of the needle is verified using x-ray pictures.  After the needle is in correct position, we then deposit the medication.  You Krone experience a pressure sensation while this is being done.  The entire block usually lasts less than 15 minutes.  Conditions that Bielinski be treated with selective nerve root blocks:  Low back and leg pain  Spinal stenosis  Diagnostic block prior to potential surgery  Neck and arm pain  Post laminectomy syndrome  Preparation for the injection:  1. Do not eat any solid food or dairy products within 8 hours of your appointment. 2. You Dass drink clear liquids up to 3 hours before an appointment.  Clear liquids include water, black coffee, juice or soda.  No milk or cream please. 3. You [place] take your regular medications, including pain medications, with a sip of water before your appointment.  Diabetics  should hold regular insulin (if taken separately) and take 1/2 normal NPH dose the morning of the procedure.  Carry some sugar containing items with you to your appointment. 4. A driver must accompany you and  be prepared to drive you home after your procedure. 5. Bring all your current medications with you. 6. An IV Klink be inserted and sedation Smyers be given at the discretion of the physician. 7. A blood pressure cuff, EKG, and other monitors will often be applied during the procedure.  Some patients Shimizu need to have extra oxygen administered for a short period. 8. You will be asked to provide medical information, including allergies, prior to the procedure.  We must know immediately if you are taking blood  Thinners (like Coumadin) or if you are allergic to IV iodine contrast (dye).  Possible side-effects: All are usually temporary  Bleeding from needle site  Light headedness  Numbness and tingling  Decreased blood pressure  Weakness in arms/legs  Pressure sensation in back/neck  Pain at injection site (several days)  Possible complications: All are extremely rare  Infection  Nerve injury  Spinal headache (a headache wore with upright position)  Call if you experience:  Fever/chills associated with headache or increased back/neck pain  Headache worsened by an upright position  New onset weakness or numbness of an extremity below the injection site  Hives or difficulty breathing (go to the emergency room)  Inflammation or drainage at the injection site(s)  Severe back/neck pain greater than usual  New symptoms which are concerning to you  Please note:  Although the local anesthetic injected can often make your back or neck feel good for several hours after the injection the pain will likely return.  It takes 3-5 days for steroids to work on the nerve root. You Mapel not notice any pain relief for at least one week.  If effective, we will often do a series of 3 injections spaced 3-6 weeks apart to maximally decrease your pain.    If you have any questions, please call [phone] [facility] 

## 2017-04-11 NOTE — Progress Notes (Signed)
Patient's Name: Chelsea [name]  MRN: [id number]  Referring Provider: Jerl Mina, MD  DOB: 02/01/1961  PCP: Jerl Mina, MD  DOS: 04/12/2017  Note by: Oswaldo Done, MD  Service setting: Ambulatory outpatient  Specialty: Interventional Pain Management  Patient type: Established  Location: [facility] (AMB) Pain Management Facility  Visit type: Interventional Procedure   Primary Reason for Visit: Interventional Pain Management Treatment. CC: Back Pain (lower)  Procedure:  Anesthesia, Analgesia, Anxiolysis:  Type: Therapeutic Trans-Foraminal Epidural Steroid Injection #2 Region: Posterolateral Lumbosacral Level & Laterality: Right L4 and L5 + left L5 transforaminal epidural steroid injection #2       Type: Local Anesthesia with Moderate (Conscious) Sedation Local Anesthetic: Lidocaine 1% Route: Intravenous (IV) IV Access: Secured Sedation: Meaningful verbal contact was maintained at all times during the procedure  Indication(s): Analgesia and Anxiety   Indications: 1. 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 Lumbar spondylosis with radiculitis (Bilateral) (L5)   5. Chronic pain of lower extremity (Primary Area of Pain) (Bilateral) (R>L)    Pain Score: Pre-procedure: 2 /10 Post-procedure: 0-No pain/10  Pre-op Assessment:  [name] is a 57 y.o. (year old), female patient, seen today for interventional treatment. She  has a past surgical history that includes Abdominal hysterectomy; Tubal ligation; and nose. [name] has a current medication list which includes the following prescription(s): acetaminophen, amitriptyline, aspirin-acetaminophen, aspirin-acetaminophen-caffeine, citalopram, diphenhydramine-apap (sleep), furosemide, gabapentin, ibuprofen, lorazepam, losartan, naproxen sodium, and omega-3 fatty acids, and the following Facility-Administered Medications: fentanyl and  midazolam. Her primarily concern today is the Back Pain (lower)  Initial Vital Signs: There were no vitals taken for this visit. BMI: Estimated body mass index is 37.62 kg/m as calculated from the following:   Height as of this encounter: 5' 2.5" (1.588 m).   Weight as of this encounter: 209 lb (94.8 kg).  Risk Assessment: Allergies: Reviewed. She is allergic to codeine.  Allergy Precautions: None required Coagulopathies: Reviewed. None identified.  Blood-thinner therapy: None at this time Active Infection(s): Reviewed. None identified. [name] is afebrile  Site Confirmation: [name] was asked to confirm the procedure and laterality before marking the site Procedure checklist: Completed Consent: Before the procedure and under the influence of no sedative(s), amnesic(s), or anxiolytics, the patient was informed of the treatment options, risks and possible complications. To fulfill our ethical and legal obligations, as recommended by the American Medical Association's Code of Ethics, I have informed the patient of my clinical impression; the nature and purpose of the treatment or procedure; the risks, benefits, and possible complications of the intervention; the alternatives, including doing nothing; the risk(s) and benefit(s) of the alternative treatment(s) or procedure(s); and the risk(s) and benefit(s) of doing nothing. The patient was provided information about the general risks and possible complications associated with the procedure. These [place] include, but are not limited to: failure to achieve desired goals, infection, bleeding, organ or nerve damage, allergic reactions, paralysis, and death. In addition, the patient was informed of those risks and complications associated to Spine-related procedures, such as failure to decrease pain; infection (i.e.: Meningitis, epidural or intraspinal abscess); bleeding (i.e.: epidural hematoma, subarachnoid hemorrhage, or any other type of intraspinal or  peri-dural bleeding); organ or nerve damage (i.e.: Any type of peripheral nerve, nerve root, or spinal cord injury) with subsequent damage to sensory, motor, and/or autonomic systems, resulting in permanent pain, numbness, and/or weakness of one  or several areas of the body; allergic reactions; (i.e.: anaphylactic reaction); and/or death. Furthermore, the patient was informed of those risks and complications associated with the medications. These include, but are not limited to: allergic reactions (i.e.: anaphylactic or anaphylactoid reaction(s)); adrenal axis suppression; blood sugar elevation that in diabetics [place] result in ketoacidosis or comma; water retention that in patients with history of congestive heart failure Riso result in shortness of breath, pulmonary edema, and decompensation with resultant heart failure; weight gain; swelling or edema; medication-induced neural toxicity; particulate matter embolism and blood vessel occlusion with resultant organ, and/or nervous system infarction; and/or aseptic necrosis of one or more joints. Finally, the patient was informed that Medicine is not an exact science; therefore, there is also the possibility of unforeseen or unpredictable risks and/or possible complications that [place] result in a catastrophic outcome. The patient indicated having understood very clearly. We have given the patient no guarantees and we have made no promises. Enough time was given to the patient to ask questions, all of which were answered to the patient's satisfaction. [name] has indicated that she wanted to continue with the procedure. Attestation: I, the ordering provider, attest that I have discussed with the patient the benefits, risks, side-effects, alternatives, likelihood of achieving goals, and potential problems during recovery for the procedure that I have provided informed consent. Date: 04/12/2017; Time: 8:27 AM  Pre-Procedure Preparation:  Monitoring: As per clinic  protocol. Respiration, ETCO2, SpO2, BP, heart rate and rhythm monitor placed and checked for adequate function Safety Precautions: Patient was assessed for positional comfort and pressure points before starting the procedure. Time-out: I initiated and conducted the "Time-out" before starting the procedure, as per protocol. The patient was asked to participate by confirming the accuracy of the "Time Out" information. Verification of the correct person, site, and procedure were performed and confirmed by me, the nursing staff, and the patient. "Time-out" conducted as per Joint Commission's Universal Protocol (UP.01.01.01). "Time-out" Date & Time: 04/12/2017; 1125 hrs.  Description of Procedure Process:   Position: Prone Target Area: For Epidural Steroid injection(s), the target area is the  interlaminar space, initially targeting the lower border of the superior vertebral body lamina. Approach: Paravertebral approach. Area Prepped: Entire Posterior Lumbosacral Area Prepping solution: ChloraPrep (2% chlorhexidine gluconate and 70% isopropyl alcohol) Safety Precautions: Aspiration looking for blood return was conducted prior to all injections. At no point did we inject any substances, as a needle was being advanced. No attempts were made at seeking any paresthesias. Safe injection practices and needle disposal techniques used. Medications properly checked for expiration dates. SDV (single dose vial) medications used. Description of the Procedure: Protocol guidelines were followed. The patient was placed in position over the procedure table. The target area was identified and the area prepped in the usual manner. Skin desensitized using vapocoolant spray. Skin & deeper tissues infiltrated with local anesthetic. Appropriate amount of time allowed to pass for local anesthetics to take effect. The procedure needles were then advanced to the target area. Proper needle placement secured. Negative aspiration  confirmed. Solution injected in intermittent fashion, asking for systemic symptoms every 0.5cc of injectate. The needles were then removed and the area cleansed, making sure to leave some of the prepping solution back to take advantage of its long term bactericidal properties. Vitals:   04/12/17 1150 04/12/17 1201 04/12/17 1210 04/12/17 1220  BP: 135/82 139/86 (!) 151/94 137/80  Pulse: 80     Resp: 15 16 13 16   Temp:  97.8 F (  36.6 C)    TempSrc:  Temporal    SpO2: 98% (!) 88% 98% 96%  Weight:      Height:        Start Time: 1125 hrs. End Time: 1149 hrs. Materials:  Needle(s) Type: Regular needle Gauge: 22G Length: 3.5-in Medication(s): We administered midazolam, fentaNYL, lactated ringers, lidocaine, sodium chloride flush, ropivacaine (PF) 2 mg/mL (0.2%), dexamethasone, and iopamidol. Please see chart orders for dosing details.  Imaging Guidance (Spinal):  Type of Imaging Technique: Fluoroscopy Guidance (Spinal) Indication(s): Assistance in needle guidance and placement for procedures requiring needle placement in or near specific anatomical locations not easily accessible without such assistance. Exposure Time: Please see nurses notes. Contrast: Before injecting any contrast, we confirmed that the patient did not have an allergy to iodine, shellfish, or radiological contrast. Once satisfactory needle placement was completed at the desired level, radiological contrast was injected. Contrast injected under live fluoroscopy. No contrast complications. See chart for type and volume of contrast used. Fluoroscopic Guidance: I was personally present during the use of fluoroscopy. "Tunnel Vision Technique" used to obtain the best possible view of the target area. Parallax error corrected before commencing the procedure. "Direction-depth-direction" technique used to introduce the needle under continuous pulsed fluoroscopy. Once target was reached, antero-posterior, oblique, and lateral  fluoroscopic projection used confirm needle placement in all planes. Images permanently stored in EMR. Interpretation: I personally interpreted the imaging intraoperatively. Adequate needle placement confirmed in multiple planes. Appropriate spread of contrast into desired area was observed. No evidence of afferent or efferent intravascular uptake. No intrathecal or subarachnoid spread observed. Permanent images saved into the patient's record.  Antibiotic Prophylaxis:  Indication(s): None identified Antibiotic given: None  Post-operative Assessment:  EBL: None Complications: No immediate post-treatment complications observed by team, or reported by patient. Note: The patient tolerated the entire procedure well. A repeat set of vitals were taken after the procedure and the patient was kept under observation following institutional policy, for this type of procedure. Post-procedural neurological assessment was performed, showing return to baseline, prior to discharge. The patient was provided with post-procedure discharge instructions, including a section on how to identify potential problems. Should any problems arise concerning this procedure, the patient was given instructions to immediately contact us, at any time, without hesitation. In any case, we plan to contact the patient by telephone for a follow-up status report regarding this interventional procedure. Comments:  No additional relevant information.  Plan of Care    Imaging Orders     DG C-Arm 1-60 Min-No Report  Procedure Orders     Lumbar Transforaminal Epidural  Medications ordered for procedure: Meds ordered this encounter  Medications  . midazolam (VERSED) 5 MG/5ML injection 1-2 mg    Make sure Flumazenil is available in the pyxis when using this medication. If oversedation occurs, administer 0.2 mg IV over 15 sec. If after 45 sec no response, administer 0.2 mg again over 1 min; Kuehnle repeat at 1 min intervals; not to exceed 4  doses (1 mg)  . fentaNYL (SUBLIMAZE) injection 25-50 mcg    Make sure Narcan is available in the pyxis when using this medication. In the event of respiratory depression (RR< 8/min): Titrate NARCAN (naloxone) in increments of 0.1 to 0.2 mg IV at 2-3 minute intervals, until desired degree of reversal.  . lactated ringers infusion 1,000 mL  . lidocaine (XYLOCAINE) 2 % (with pres) injection 400 mg  . sodium chloride flush (NS) 0.9 % injection 3 mL  . ropivacaine (PF)  2 mg/mL (0.2%) (NAROPIN) injection 3 mL  . dexamethasone (DECADRON) injection 20 mg  . iopamidol (ISOVUE-M) 41 % intrathecal injection 10 mL   Medications administered: We administered midazolam, fentaNYL, lactated ringers, lidocaine, sodium chloride flush, ropivacaine (PF) 2 mg/mL (0.2%), dexamethasone, and iopamidol.  See the medical record for exact dosing, route, and time of administration.  New Prescriptions   No medications on file   Disposition: Discharge home  Discharge Date & Time: 04/12/2017; 1230 hrs.   Physician-requested Follow-up: Return for post-procedure eval (2 wks), w/ Dr. Laban Emperor. Future Appointments  Date Time Provider Department Center  05/02/2017  9:15 AM Delano Metz, MD [facility] None   Primary Care Physician: Jerl Mina, MD Location: [facility] Outpatient Pain Management Facility Note by: Oswaldo Done, MD Date: 04/12/2017; Time: 12:44 PM  Disclaimer:  Medicine is not an exact science. The only guarantee in medicine is that nothing is guaranteed. It is important to note that the decision to proceed with this intervention was based on the information collected from the patient. The Data and conclusions were drawn from the patient's questionnaire, the interview, and the physical examination. Because the information was provided in large part by the patient, it cannot be guaranteed that it has not been purposely or unconsciously manipulated. Every effort has been made to obtain as much relevant  data as possible for this evaluation. It is important to note that the conclusions that lead to this procedure are derived in large part from the available data. Always take into account that the treatment will also be dependent on availability of resources and existing treatment guidelines, considered by other Pain Management Practitioners as being common knowledge and practice, at the time of the intervention. For Medico-Legal purposes, it is also important to point out that variation in procedural techniques and pharmacological choices are the acceptable norm. The indications, contraindications, technique, and results of the above procedure should only be interpreted and judged by a Board-Certified Interventional Pain Specialist with extensive familiarity and expertise in the same exact procedure and technique.

## 2017-04-12 ENCOUNTER — Encounter: Payer: Self-pay | Admitting: Pain Medicine

## 2017-04-12 ENCOUNTER — Ambulatory Visit
Admission: RE | Admit: 2017-04-12 | Discharge: 2017-04-12 | Disposition: A | Discharge status: Home / Self Care | Payer: BLUE CROSS/BLUE SHIELD | Source: Ambulatory Visit | Attending: Pain Medicine | Admitting: Pain Medicine

## 2017-04-12 ENCOUNTER — Other Ambulatory Visit: Payer: Self-pay

## 2017-04-12 ENCOUNTER — Ambulatory Visit (HOSPITAL_BASED_OUTPATIENT_CLINIC_OR_DEPARTMENT_OTHER): Payer: BLUE CROSS/BLUE SHIELD | Admitting: Pain Medicine

## 2017-04-12 VITALS — BP 137/80 | HR 80 | Temp 97.8°F | Resp 16 | Ht 62.5 in | Wt 209.0 lb

## 2017-04-12 DIAGNOSIS — M545 Low back pain: Secondary | ICD-10-CM | POA: Diagnosis present

## 2017-04-12 DIAGNOSIS — M48061 Spinal stenosis, lumbar region without neurogenic claudication: Secondary | ICD-10-CM

## 2017-04-12 DIAGNOSIS — M5116 Intervertebral disc disorders with radiculopathy, lumbar region: Secondary | ICD-10-CM | POA: Insufficient documentation

## 2017-04-12 DIAGNOSIS — M79605 Pain in left leg: Secondary | ICD-10-CM | POA: Diagnosis not present

## 2017-04-12 DIAGNOSIS — M79604 Pain in right leg: Secondary | ICD-10-CM

## 2017-04-12 DIAGNOSIS — G8929 Other chronic pain: Secondary | ICD-10-CM

## 2017-04-12 DIAGNOSIS — M4726 Other spondylosis with radiculopathy, lumbar region: Secondary | ICD-10-CM

## 2017-04-12 DIAGNOSIS — M47816 Spondylosis without myelopathy or radiculopathy, lumbar region: Secondary | ICD-10-CM | POA: Insufficient documentation

## 2017-04-12 DIAGNOSIS — M5416 Radiculopathy, lumbar region: Secondary | ICD-10-CM | POA: Diagnosis not present

## 2017-04-12 DIAGNOSIS — M5126 Other intervertebral disc displacement, lumbar region: Secondary | ICD-10-CM

## 2017-04-12 MED ORDER — LACTATED RINGERS IV SOLN
1000.0000 mL | Freq: Once | INTRAVENOUS | Status: AC
  Administered 2017-04-12: 1000 mL via INTRAVENOUS

## 2017-04-12 MED ORDER — FENTANYL CITRATE (PF) 100 MCG/2ML IJ SOLN
25.0000 ug | INTRAMUSCULAR | Status: DC | PRN
  Administered 2017-04-12: 50 ug via INTRAVENOUS
  Filled 2017-04-12: qty 2

## 2017-04-12 MED ORDER — SODIUM CHLORIDE 0.9% FLUSH
3.0000 mL | Freq: Once | INTRAVENOUS | Status: AC
  Administered 2017-04-12: 3 mL

## 2017-04-12 MED ORDER — DEXAMETHASONE SODIUM PHOSPHATE 10 MG/ML IJ SOLN
20.0000 mg | Freq: Once | INTRAMUSCULAR | Status: AC
  Administered 2017-04-12: 10 mg
  Filled 2017-04-12: qty 2

## 2017-04-12 MED ORDER — LIDOCAINE HCL (PF) 2 % IJ SOLN
INTRAMUSCULAR | Status: AC
  Filled 2017-04-12: qty 10

## 2017-04-12 MED ORDER — ROPIVACAINE HCL 2 MG/ML IJ SOLN
3.0000 mL | Freq: Once | INTRAMUSCULAR | Status: AC
  Administered 2017-04-12: 3 mL via EPIDURAL
  Filled 2017-04-12: qty 10

## 2017-04-12 MED ORDER — MIDAZOLAM HCL 5 MG/5ML IJ SOLN
1.0000 mg | INTRAMUSCULAR | Status: DC | PRN
  Administered 2017-04-12: 4 mg via INTRAVENOUS
  Filled 2017-04-12: qty 5

## 2017-04-12 MED ORDER — IOPAMIDOL (ISOVUE-M 200) INJECTION 41%
10.0000 mL | Freq: Once | INTRAMUSCULAR | Status: AC
  Administered 2017-04-12: 1 mL via EPIDURAL
  Filled 2017-04-12: qty 10

## 2017-04-12 MED ORDER — LIDOCAINE HCL 2 % IJ SOLN
20.0000 mL | Freq: Once | INTRAMUSCULAR | Status: AC
  Administered 2017-04-12: 200 mg
  Filled 2017-04-12: qty 40

## 2017-04-12 NOTE — Progress Notes (Signed)
Safety precautions to be maintained throughout the outpatient stay will include: orient to surroundings, keep bed in low position, maintain call bell within reach at all times, provide assistance with transfer out of bed and ambulation.  

## 2017-04-12 NOTE — Patient Instructions (Signed)
____________________________________________________________________________________________ ° °Genicular Nerve Block ° °What is a genicular nerve block? °A genicular nerve block is the injection of a local anesthetic to block the nerves that transmits pain from the knee. ° °What is the purpose of a facet nerve block? °A genicular nerve block is a diagnostic procedure to determine if the pathologic changes (i.e. arthritis, meniscal tears, etc) and inflammation within the knee joint is the source of your knee pain. It also confirms that the knee pain will respond well to the actual treatment procedure. If a genicular nerve block works, it will give you relief for several hours. After that, the pain is expected to return to normal. This test is always performed twice (usually a week or two apart) because two successful tests are required to move onto treatment. If both diagnostic tests are positive, then we schedule a treatment called radiofrequency (RF) ablation. In this procedure, the same nerves are cauterized, which typically leads to pain relief for 4 -18 months. If this process works well for one knee, it can be performed on the other knee if needed. ° °How is the procedure performed? °You will be placed on the procedure table. The injection site is sterilized with either iodine or chlorhexadine. The site to be injected is numbed with a local anesthetic, and a needle is directed to the target area. X-ray guidance is used to ensure proper placement and positioning of the needle. When the needle is properly positioned near the genicular nerve, local anesthetic is injected to numb that nerve. This will be repeated at multiple sites around the knee to block all genicular nerves. ° °Will the procedure be painful? °The injection can be painful and we therefore provide the option of receiving IV sedation. IV sedation, combined with local anesthetic, can make the injection nearly pain free. It allows you to remain very  still during the procedure, which can also make the injection easier, faster, and more successful. If you decide to have IV sedation, you must have a driver to get you home safely afterwards. In addition, you cannot have anything to eat or drink within 8 hours of your appointment (clear liquids are allowed until 3 hours before the procedure). If you take medications for diabetes, these medications [place] need to be adjusted the morning of the procedure. Your primary care physician can help you with this adjustment. ° °What are the discharge instructions? °If you received IV sedation do not drive or operate machinery for at least 24 hours after the procedure. You [place] return to work the next day following your procedure. You [place]ra resume your normal diet immediately. Do not engage in any strenuous activity for 24 hours. You should, however, engage in moderate activity that typically causes your ususal pain. If the block works, those activities should not be painful for several hours after the injection. Do not take a bath, swim, or use a hot tub for 24 hours (you [place] take a shower). Call the office if you have any of the following: severe pain afterwards (different than your usual symptoms), redness/swelling/discharge at the injection site(s), fevers/chills, difficulty with bowel or bladder functions. ° °What are the risks and side effects? °The complication rate for this procedure is very low. Whenever a needle enters the skin, bleeding or infection can occur. Some other serious but extremely rare risks include paralysis and death. °You [place] have an allergic reaction to any of the medications used. If you have a known allergy to any medications, especially local anesthetics, notify   our staff before the procedure takes place. °You [place] experience any of the following side effects up to 4 - 6 hours after the procedure: °• Leg muscle weakness or numbness [place]utter occur due to the local anesthetic affecting the nerves that control  your legs (this is a temporary affect and it is not paralysis). If you have any leg weakness or numbness, walk only with assistance in order to prevent falls and injury. Your leg strength will return slowly and completely. °• Dizziness [place]isen[place] occur due to a decrease in your blood pressure. If this occurs, remain in a seated or lying position. Gradually sit up, and then stand after at least 10 minutes of sitting. °• Mild headaches [place] occur. Drink fluids and take pain medications if needed. If the headaches persist or become severe, call the office. °• Mild discomfort at the injection site can occur. This typically lasts for a few hours but can persist for a couple days. If this occurs, take anti-inflammatories or pain medications, apply ice to the area the day of the procedure. If it persists, apply moist heat in the day(s) following. ° °The side effects listed above can be normal. They are not dangerous and will resolve on their own. If, however, you experience any of the following, a complication Mcmains have occurred and you should either contact your doctor. If he is not readily available, then you should proceed to the closest urgent care center for evaluation: °• Severe or progressive pain at the injection site(s) °• Arm or leg weakness that progressively worsens or persists for longer than 8 hours °• Severe or progressive redness, swelling, or discharge from the injections site(s) °• Fevers, chills, nausea, or vomiting °• Bowel or bladder dysfunction (i.e. inability to urinate or pass stool or difficulty controlling either) ° °How long does it take for the procedure to work? °You should feel relief from your usual pain within the first hour. Again, this is only expected to last for several hours, at the most. Remember, you [place]anc[place] be sore in the middle part of your back from the needles, and you must distinguish this from your usual  pain. °____________________________________________________________________________________________ ° ° °

## 2017-04-13 ENCOUNTER — Telehealth: Payer: Self-pay

## 2017-04-13 NOTE — Telephone Encounter (Signed)
Pt states she is having more pain this time then last. Instructed to use heat today and waith for thesteroids to start working and if needed to call us or go to the ED if any problems

## 2017-04-30 ENCOUNTER — Ambulatory Visit: Payer: BLUE CROSS/BLUE SHIELD | Admitting: Pain Medicine

## 2017-05-02 ENCOUNTER — Encounter: Payer: Self-pay | Admitting: Pain Medicine

## 2017-05-02 ENCOUNTER — Ambulatory Visit: Payer: BLUE CROSS/BLUE SHIELD | Attending: Pain Medicine | Admitting: Pain Medicine

## 2017-05-02 VITALS — BP 172/93 | HR 104 | Temp 98.2°F | Resp 16 | Ht 62.5 in | Wt 209.0 lb

## 2017-05-02 DIAGNOSIS — G8929 Other chronic pain: Secondary | ICD-10-CM

## 2017-05-02 DIAGNOSIS — G894 Chronic pain syndrome: Secondary | ICD-10-CM | POA: Diagnosis not present

## 2017-05-02 DIAGNOSIS — R208 Other disturbances of skin sensation: Secondary | ICD-10-CM | POA: Diagnosis not present

## 2017-05-02 DIAGNOSIS — M5441 Lumbago with sciatica, right side: Secondary | ICD-10-CM | POA: Diagnosis not present

## 2017-05-02 DIAGNOSIS — Z7982 Long term (current) use of aspirin: Secondary | ICD-10-CM | POA: Insufficient documentation

## 2017-05-02 DIAGNOSIS — F419 Anxiety disorder, unspecified: Secondary | ICD-10-CM | POA: Insufficient documentation

## 2017-05-02 DIAGNOSIS — F17210 Nicotine dependence, cigarettes, uncomplicated: Secondary | ICD-10-CM | POA: Diagnosis not present

## 2017-05-02 DIAGNOSIS — M9983 Other biomechanical lesions of lumbar region: Secondary | ICD-10-CM

## 2017-05-02 DIAGNOSIS — M48061 Spinal stenosis, lumbar region without neurogenic claudication: Secondary | ICD-10-CM

## 2017-05-02 DIAGNOSIS — E538 Deficiency of other specified B group vitamins: Secondary | ICD-10-CM | POA: Insufficient documentation

## 2017-05-02 DIAGNOSIS — E559 Vitamin D deficiency, unspecified: Secondary | ICD-10-CM | POA: Diagnosis not present

## 2017-05-02 DIAGNOSIS — M79604 Pain in right leg: Secondary | ICD-10-CM

## 2017-05-02 DIAGNOSIS — M5442 Lumbago with sciatica, left side: Secondary | ICD-10-CM | POA: Diagnosis not present

## 2017-05-02 DIAGNOSIS — Z79899 Other long term (current) drug therapy: Secondary | ICD-10-CM | POA: Diagnosis not present

## 2017-05-02 DIAGNOSIS — Z885 Allergy status to narcotic agent status: Secondary | ICD-10-CM | POA: Diagnosis not present

## 2017-05-02 DIAGNOSIS — F329 Major depressive disorder, single episode, unspecified: Secondary | ICD-10-CM | POA: Diagnosis not present

## 2017-05-02 DIAGNOSIS — I10 Essential (primary) hypertension: Secondary | ICD-10-CM | POA: Insufficient documentation

## 2017-05-02 DIAGNOSIS — M4726 Other spondylosis with radiculopathy, lumbar region: Secondary | ICD-10-CM

## 2017-05-02 DIAGNOSIS — Z9889 Other specified postprocedural states: Secondary | ICD-10-CM | POA: Insufficient documentation

## 2017-05-02 DIAGNOSIS — M5416 Radiculopathy, lumbar region: Secondary | ICD-10-CM | POA: Diagnosis not present

## 2017-05-02 DIAGNOSIS — M5126 Other intervertebral disc displacement, lumbar region: Secondary | ICD-10-CM | POA: Diagnosis not present

## 2017-05-02 DIAGNOSIS — Z90710 Acquired absence of both cervix and uterus: Secondary | ICD-10-CM | POA: Diagnosis not present

## 2017-05-02 DIAGNOSIS — Z809 Family history of malignant neoplasm, unspecified: Secondary | ICD-10-CM | POA: Diagnosis not present

## 2017-05-02 DIAGNOSIS — M79605 Pain in left leg: Secondary | ICD-10-CM

## 2017-05-02 DIAGNOSIS — E782 Mixed hyperlipidemia: Secondary | ICD-10-CM | POA: Diagnosis not present

## 2017-05-02 NOTE — Progress Notes (Signed)
Patient's Name: Chelsea [name]  MRN: [id number]  Referring Provider: Maryland Pink, MD  DOB: [birth year]  PCP: Maryland Pink, MD  DOS: 05/02/2017  Note by: Gaspar Cola, MD  Service setting: Ambulatory outpatient  Specialty: Interventional Pain Management  Location: [facility] (AMB) Pain Management Facility    Patient type: Established   Primary Reason(s) for Visit: Encounter for post-procedure evaluation of chronic illness with mild to moderate exacerbation CC: Back Pain (bilateral); Leg Pain (bilateral neuropathy); and Foot Pain (bilateral)  HPI  Chelsea [name] is a 57 y.o. year old, female patient, who comes today for a post-procedure evaluation. 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 Her primarily concern today is the Back Pain (bilateral); Leg Pain (bilateral neuropathy); and Foot Pain (bilateral)  Pain Assessment: Location: Lower, Left, Right Back(legs and  feet) Radiating: lower back goes into butt cheeks and the back of the legs.  is better than it has been . Onset: More than a month ago Duration: Chronic pain Quality: Spasm, Burning, Discomfort(when pain is at its worse, feels as if back is going to collapse) Severity: 2 /10 (self-reported pain score)  Note: Reported level is compatible with observation.                         When using our objective Pain Scale, levels between 6 and 10/10 are said to belong in an emergency room, as it progressively worsens from a 6/10, described as severely limiting, requiring emergency care not usually available at an outpatient pain management facility. At a 6/10 level, communication becomes difficult and requires great effort. Assistance to reach the emergency department [place] be required. Facial flushing and profuse sweating along with potentially dangerous increases in heart rate and blood pressure will be evident. Effect on ADL: pain happens when she is walking. Timing: Intermittent Modifying factors: sitting down makes the pain better  Chelsea [name] comes in today for post-procedure evaluation after the treatment done on 04/12/2017.  The patient indicates that the last procedure took a long time to take effect.  She still having quite a bit of pain going down both legs, but seems to be worse on the right side and she is experiencing pain in the bottom of her feet and what seems to be the S1 dermatomal distribution, bilaterally.  This is the area where she is complaining of some hyperalgesia.  At this point we have been managing this with transforaminal epidural steroid injections at the L4 and L5 levels, but clearly we need to attempt doing something around the S1 region.  Because of this and will bring her back for her third epidural and this one will be a translaminar at the L5-S1 level.  Further details on both, my  assessment(s), as well as the proposed treatment plan, please see below.  Post-Procedure Assessment   04/12/2017 Procedure: Diagnostic/therapeutic Right L4 and L5 + left L5 transforaminal epidural steroid injection #2, under fluoroscopic guidance and IV sedation Pre-procedure pain score:  2/10 Post-procedure pain score: 0/10 (100% relief) Influential Factors: BMI: 37.62 kg/m Intra-procedural challenges: None observed.         Assessment challenges: None detected.              Reported side-effects: None.        Post-procedural adverse reactions or complications: None reported         Sedation: Sedation provided. When no sedatives are used, the analgesic levels obtained are directly associated to the effectiveness of the local anesthetics. However, when sedation is provided, the level of analgesia obtained during the initial 1 hour following the intervention, is believed to be the result of a combination of factors. These factors [place] include, but are not limited to: 1. The effectiveness of the local anesthetics used. 2. The effects of the analgesic(s) and/or anxiolytic(s) used. 3. The degree of discomfort experienced by the patient at the time of the procedure. 4. The patients ability and reliability in recalling and recording the events. 5. The presence and influence of possible secondary gains and/or psychosocial factors. Reported result: Relief experienced during the 1st hour after the procedure: 100 % (Ultra-Short Term Relief)            Interpretative annotation: Clinically appropriate result. Analgesia during this period is likely to be Local Anesthetic and/or IV Sedative (Analgesic/Anxiolytic) related.          Effects of local anesthetic: The analgesic effects attained during this period are directly associated to the localized infiltration of local anesthetics and therefore cary significant diagnostic value as to the etiological location, or anatomical origin, of the pain. Expected duration of relief is directly dependent on the pharmacodynamics of the local anesthetic used. Long-acting  (4-6 hours) anesthetics used.  Reported result: Relief during the next 4 to 6 hour after the procedure: 100 % (Short-Term Relief)            Interpretative annotation: Clinically appropriate result. Analgesia during this period is likely to be Local Anesthetic-related.          Long-term benefit: Defined as the period of time past the expected duration of local anesthetics (1 hour for short-acting and 4-6 hours for long-acting). With the possible exception of prolonged sympathetic blockade from the local anesthetics, benefits during this period are typically attributed to, or associated with, other factors such as analgesic sensory neuropraxia, antiinflammatory effects, or beneficial biochemical changes provided by agents other than the local anesthetics.  Reported result: Extended relief following procedure: 75 % (Long-Term Relief)            Interpretative annotation: Clinically appropriate result. Good relief. No permanent benefit expected. Inflammation plays a part in the etiology to the pain.          Current benefits: Defined as reported results that persistent at this point in time.   Analgesia: <75 % Ms. Shambley reports improvement of extremity symptoms. Function: Somewhat improved ROM: Somewhat improved Interpretative annotation: Partial relief. No permanent benefit expected. Effective therapeutic approach.          Interpretation: Results would suggest a successful diagnostic intervention.                  Plan:  At this point, we will change the approach from transforaminal to  interlaminar to see if we can address the issues with the S1 nerve root.        Laboratory Chemistry  Inflammation Markers (CRP: Acute Phase) (ESR: Chronic Phase) Lab Results  Component Value Date   CRP 2.5 02/06/2017   ESRSEDRATE 15 02/06/2017                 Renal Function Markers Lab Results  Component Value Date   BUN 15 02/06/2017   CREATININE 0.92 02/06/2017   GFRAA 82 02/06/2017   GFRNONAA 71  02/06/2017                 Hepatic Function Markers Lab Results  Component Value Date   AST 22 02/06/2017   ALBUMIN 4.3 02/06/2017   ALKPHOS 80 02/06/2017                 Electrolytes Lab Results  Component Value Date   NA 135 02/06/2017   K 4.2 02/06/2017   CL 95 (L) 02/06/2017   CALCIUM 9.7 02/06/2017   MG 1.9 02/06/2017                 Neuropathy Markers Lab Results  Component Value Date   VITAMINB12 1,523 (H) 02/06/2017                 Bone Pathology Markers Lab Results  Component Value Date   25OHVITD1 32 02/06/2017   25OHVITD2 1.5 02/06/2017   25OHVITD3 30 02/06/2017                 Coagulation Parameters Lab Results  Component Value Date   INR 0.9 04/30/2011   LABPROT 12.6 04/30/2011   APTT 28.7 04/30/2011   PLT 381 04/30/2011                 Cardiovascular Markers Lab Results  Component Value Date   HGB 14.3 04/30/2011   HCT 40.7 04/30/2011                 Note: Lab results reviewed.  Recent Diagnostic Imaging Results  DG C-Arm 1-60 Min-No Report Fluoroscopy was utilized by the requesting physician.  No radiographic  interpretation.   Complexity Note: Imaging results reviewed. Results shared with Ms. Burgio, using Layman's terms.                         Meds   Current Outpatient Medications:  .  acetaminophen (TYLENOL) 500 MG tablet, Take by mouth., Disp: , Rfl:  .  amitriptyline (ELAVIL) 10 MG tablet, TAKE 1 TO 2 TABLETS BY MOUTH AT BEDTIME, Disp: , Rfl:  .  Aspirin-Acetaminophen (GOODY BODY PAIN) 500-325 MG PACK, Take by mouth 2 (two) times daily as needed., Disp: , Rfl:  .  Aspirin-Acetaminophen-Caffeine (GOODY HEADACHE PO), Take by mouth 2 (two) times daily as needed., Disp: , Rfl:  .  citalopram (CELEXA) 20 MG tablet, Take 20 mg daily by mouth., Disp: , Rfl: 3 .  Diphenhydramine-APAP, sleep, (GOODY PM PO), Take by mouth daily., Disp: , Rfl:  .  furosemide (LASIX) 20 MG tablet, Take by mouth., Disp: , Rfl:  .  gabapentin (NEURONTIN) 100  MG capsule, TAKE 1 CAPSULE BY MOUTH 3 TIMES A DAY . CAN TITRATE UP TO 3 CAPSULES ('300MG'$ ) 3 TIMES A DAY ., Disp: , Rfl:  .  ibuprofen (ADVIL,MOTRIN) 200 MG tablet, Take by mouth., Disp: , Rfl:  .  LORazepam (ATIVAN) 0.5 MG tablet, Take by mouth., Disp: ,  Rfl:  .  losartan (COZAAR) 100 MG tablet, Take 100 mg daily by mouth., Disp: , Rfl: 10 .  naproxen sodium (ALEVE) 220 MG tablet, Take by mouth., Disp: , Rfl:  .  Omega-3 Fatty Acids (FISH OIL PO), Take by mouth., Disp: , Rfl:   ROS  Constitutional: Denies any fever or chills Gastrointestinal: No reported hemesis, hematochezia, vomiting, or acute GI distress Musculoskeletal: Denies any acute onset joint swelling, redness, loss of ROM, or weakness Neurological: No reported episodes of acute onset apraxia, aphasia, dysarthria, agnosia, amnesia, paralysis, loss of coordination, or loss of consciousness  Allergies  Ms. Ryans is allergic to codeine.  PFSH  Drug: Ms. Biehl  reports that she does not use drugs. Alcohol:  reports that she drinks alcohol. Tobacco:  reports that she has been smoking cigarettes.  She has a 39.00 pack-year smoking history. she has never used smokeless tobacco. Medical:  has a past medical history of Allergy, Anxiety, Depression, Hyperlipidemia, Hypertension, Sleep apnea, Vitamin B12 deficiency, and Vitamin D deficiency. Surgical: Ms. Madonia  has a past surgical history that includes Abdominal hysterectomy; Tubal ligation; and nose. Family: family history includes Cancer in her father.  Constitutional Exam  General appearance: Well nourished, well developed, and well hydrated. In no apparent acute distress Vitals:   05/02/17 0916  BP: (!) 172/93  Pulse: (!) 104  Resp: 16  Temp: 98.2 F (36.8 C)  TempSrc: Oral  SpO2: 99%  Weight: 209 lb (94.8 kg)  Height: 5' 2.5" (1.588 m)   BMI Assessment: Estimated body mass index is 37.62 kg/m as calculated from the following:   Height as of this encounter: 5' 2.5" (1.588 m).    Weight as of this encounter: 209 lb (94.8 kg).  BMI interpretation table: BMI level Category Range association with higher incidence of chronic pain  <18 kg/m2 Underweight   18.5-24.9 kg/m2 Ideal body weight   25-29.9 kg/m2 Overweight Increased incidence by 20%  30-34.9 kg/m2 Obese (Class I) Increased incidence by 68%  35-39.9 kg/m2 Severe obesity (Class II) Increased incidence by 136%  >40 kg/m2 Extreme obesity (Class III) Increased incidence by 254%   BMI Readings from Last 4 Encounters:  05/02/17 37.62 kg/m  04/12/17 37.62 kg/m  03/21/17 38.23 kg/m  03/08/17 40.15 kg/m   Wt Readings from Last 4 Encounters:  05/02/17 209 lb (94.8 kg)  04/12/17 209 lb (94.8 kg)  03/21/17 209 lb (94.8 kg)  03/08/17 209 lb (94.8 kg)  Psych/Mental status: Alert, oriented x 3 (person, place, & time)       Eyes: PERLA Respiratory: No evidence of acute respiratory distress  Cervical Spine Area Exam  Skin & Axial Inspection: No masses, redness, edema, swelling, or associated skin lesions Alignment: Symmetrical Functional ROM: Unrestricted ROM      Stability: No instability detected Muscle Tone/Strength: Functionally intact. No obvious neuro-muscular anomalies detected. Sensory (Neurological): Unimpaired Palpation: No palpable anomalies              Upper Extremity (UE) Exam    Side: Right upper extremity  Side: Left upper extremity  Skin & Extremity Inspection: Skin color, temperature, and hair growth are WNL. No peripheral edema or cyanosis. No masses, redness, swelling, asymmetry, or associated skin lesions. No contractures.  Skin & Extremity Inspection: Skin color, temperature, and hair growth are WNL. No peripheral edema or cyanosis. No masses, redness, swelling, asymmetry, or associated skin lesions. No contractures.  Functional ROM: Unrestricted ROM          Functional ROM:  Unrestricted ROM          Muscle Tone/Strength: Functionally intact. No obvious neuro-muscular anomalies detected.   Muscle Tone/Strength: Functionally intact. No obvious neuro-muscular anomalies detected.  Sensory (Neurological): Unimpaired          Sensory (Neurological): Unimpaired          Palpation: No palpable anomalies              Palpation: No palpable anomalies              Specialized Test(s): Deferred         Specialized Test(s): Deferred          Thoracic Spine Area Exam  Skin & Axial Inspection: No masses, redness, or swelling Alignment: Symmetrical Functional ROM: Unrestricted ROM Stability: No instability detected Muscle Tone/Strength: Functionally intact. No obvious neuro-muscular anomalies detected. Sensory (Neurological): Unimpaired Muscle strength & Tone: No palpable anomalies  Lumbar Spine Area Exam  Skin & Axial Inspection: No masses, redness, or swelling Alignment: Symmetrical Functional ROM: Unrestricted ROM      Stability: No instability detected Muscle Tone/Strength: Functionally intact. No obvious neuro-muscular anomalies detected. Sensory (Neurological): Unimpaired Palpation: No palpable anomalies       Provocative Tests: Lumbar Hyperextension and rotation test: evaluation deferred today       Lumbar Lateral bending test: evaluation deferred today       Patrick's Maneuver: evaluation deferred today                    Gait & Posture Assessment  Ambulation: Unassisted Gait: Relatively normal for age and body habitus Posture: WNL   Lower Extremity Exam    Side: Right lower extremity  Side: Left lower extremity  Skin & Extremity Inspection: Skin color, temperature, and hair growth are WNL. No peripheral edema or cyanosis. No masses, redness, swelling, asymmetry, or associated skin lesions. No contractures.  Skin & Extremity Inspection: Skin color, temperature, and hair growth are WNL. No peripheral edema or cyanosis. No masses, redness, swelling, asymmetry, or associated skin lesions. No contractures.  Functional ROM: Unrestricted ROM          Functional ROM: Unrestricted  ROM          Muscle Tone/Strength: Functionally intact. No obvious neuro-muscular anomalies detected.  Muscle Tone/Strength: Functionally intact. No obvious neuro-muscular anomalies detected.  Sensory (Neurological): Unimpaired  Sensory (Neurological): Unimpaired  Palpation: No palpable anomalies  Palpation: No palpable anomalies   Assessment  Primary Diagnosis & Pertinent Problem List: The primary encounter diagnosis was Chronic lower extremity pain (Primary Area of Pain) (Bilateral) (R>L). 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  Status Diagnosis  Improving Stable Stable 1. Chronic lower extremity pain (Primary Area of Pain) (Bilateral) (R>L)   2. 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 Lumbar spondylosis with radiculitis (Bilateral) (L5)   5. Chronic L5 Lumbar radiculitis (Bilateral) (R>L)   6. Chronic low back pain (Secondary Area of Pain) (Bilateral) (R>L)   7. Right S1 Hyperalgesia     Problems updated and reviewed during this visit: Problem  Right S1 Hyperalgesia  Chronic L5 & S1 Lumbar radiculitis (Bilateral) (R>L)   According to the patient the pain pattern goes through the top of the  foot and into the big toe, bilaterally, with the right being worst on the left.   Chronic lower extremity pain (Primary Area of Pain) (Bilateral) (R>L)   Plan of Care  Pharmacotherapy (Medications Ordered): No orders of the defined types were placed in this encounter.  Medications administered today: Ayushi Y. Gautreau had no medications administered during this visit.   Procedure Orders     Lumbar Epidural Injection Lab Orders  No laboratory test(s)  ordered today   Imaging Orders  No imaging studies ordered today   Referral Orders  No referral(s) requested today    Interventional management options: Planned, scheduled, and/or pending:   Diagnostic right L5-S1 interlaminar LESI under fluoro and IV sedation.   Considering:   DiagnosticLidocaine infusionto evaluate the idiopathic small fiber peripheral neuropathy, and to see if she Jacot be a good candidate for mexiletine. Diagnostic bilateral lumbar sympathetic blocks Possible bilateral lumbar facet RFA Diagnostic bilateral lumbar facet nerve block Possible bilateral lumbar facet RFA Diagnostic bilateral sacroiliac joint block Possible bilateral sacroiliac joint RFA Diagnostic bilateral intra-articular hip joint injection Diagnostic bilateral femoral nerve +obturatornerve block Possible bilateral femoral nerve + obturator nerve RFA Possible spinal cord stimulator trial   Palliative PRN treatment(s):   None at this time   Provider-requested follow-up: Return for Procedure (w/ sedation): (R) L5-S1 LESI.  Future Appointments  Date Time Provider [place]  05/10/2017  8:30 AM Milinda Pointer, MD [facility] None   Primary Care Physician: Maryland Pink, MD Location: [facility] Note by: Gaspar Cola, MD Date: 05/02/2017; Time: 1:18 PM

## 2017-05-02 NOTE — Patient Instructions (Addendum)
____________________________________________________________________________________________  Preparing for Procedure with Sedation Instructions: . Oral Intake: Do not eat or drink anything for at least 8 hours prior to your procedure. . Transportation: Public transportation is not allowed. Bring an adult driver. The driver must be physically present in our waiting room before any procedure can be started. Marland Kitchen Physical Assistance: Bring an adult physically capable of assisting you, in the event you need help. This adult should keep you company at home for at least 6 hours after the procedure. . Blood Pressure Medicine: Take your blood pressure medicine with a sip of water the morning of the procedure. . Blood thinners:  . Diabetics on insulin: Notify the staff so that you can be scheduled 1st case in the morning. If your diabetes requires high dose insulin, take only  of your normal insulin dose the morning of the procedure and notify the staff that you have done so. . Preventing infections: Shower with an antibacterial soap the morning of your procedure. . Build-up your immune system: Take 1000 mg of Vitamin C with every meal (3 times a day) the day prior to your procedure. Marland Kitchen Antibiotics: Inform the staff if you have a condition or reason that requires you to take antibiotics before dental procedures. . Pregnancy: If you are pregnant, call and cancel the procedure. . Sickness: If you have a cold, fever, or any active infections, call and cancel the procedure. . Arrival: You must be in the facility at least 30 minutes prior to your scheduled procedure. . Children: Do not bring children with you. . Dress appropriately: Bring dark clothing that you would not mind if they get stained. . Valuables: Do not bring any jewelry or valuables. Procedure appointments are reserved for interventional treatments only. Marland Kitchen No Prescription Refills. . No medication changes will be discussed during procedure  appointments. . No disability issues will be discussed. ____________________________________________________________________________________________   Epidural Steroid Injection Patient Information  Description: The epidural space surrounds the nerves as they exit the spinal cord.  In some patients, the nerves can be compressed and inflamed by a bulging disc or a tight spinal canal (spinal stenosis).  By injecting steroids into the epidural space, we can bring irritated nerves into direct contact with a potentially helpful medication.  These steroids act directly on the irritated nerves and can reduce swelling and inflammation which often leads to decreased pain.  Epidural steroids Riedlinger be injected anywhere along the spine and from the neck to the low back depending upon the location of your pain.   After numbing the skin with local anesthetic (like Novocaine), a small needle is passed into the epidural space slowly.  You Kochanski experience a sensation of pressure while this is being done.  The entire block usually last less than 10 minutes.  Conditions which Tallent be treated by epidural steroids:   Low back and leg pain  Neck and arm pain  Spinal stenosis  Post-laminectomy syndrome  Herpes zoster (shingles) pain  Pain from compression fractures  Preparation for the injection:  1. Do not eat any solid food or dairy products within 8 hours of your appointment.  2. You Aispuro drink clear liquids up to 3 hours before appointment.  Clear liquids include water, black coffee, juice or soda.  No milk or cream please. 3. You Dhami take your regular medication, including pain medications, with a sip of water before your appointment  Diabetics should hold regular insulin (if taken separately) and take 1/2 normal NPH dos the morning of  the procedure.  Carry some sugar containing items with you to your appointment. 4. A driver must accompany you and be prepared to drive you home after your procedure.   5. Bring all your current medications with your. 6. An IV Belflower be inserted and sedation Bless be given at the discretion of the physician.   7. A blood pressure cuff, EKG and other monitors will often be applied during the procedure.  Some patients Madonna need to have extra oxygen administered for a short period. 8. You will be asked to provide medical information, including your allergies, prior to the procedure.  We must know immediately if you are taking blood thinners (like Coumadin/Warfarin)  Or if you are allergic to IV iodine contrast (dye). We must know if you could possible be pregnant.  Possible side-effects:  Bleeding from needle site  Infection (rare, Dani require surgery)  Nerve injury (rare)  Numbness & tingling (temporary)  Difficulty urinating (rare, temporary)  Spinal headache ( a headache worse with upright posture)  Light -headedness (temporary)  Pain at injection site (several days)  Decreased blood pressure (temporary)  Weakness in arm/leg (temporary)  Pressure sensation in back/neck (temporary)  Call if you experience:  Fever/chills associated with headache or increased back/neck pain.  Headache worsened by an upright position.  New onset weakness or numbness of an extremity below the injection site  Hives or difficulty breathing (go to the emergency room)  Inflammation or drainage at the infection site  Severe back/neck pain  Any new symptoms which are concerning to you  Please note:  Although the local anesthetic injected can often make your back or neck feel good for several hours after the injection, the pain will likely return.  It takes 3-7 days for steroids to work in the epidural space.  You Pilar not notice any pain relief for at least that one week.  If effective, we will often do a series of three injections spaced 3-6 weeks apart to maximally decrease your pain.  After the initial series, we generally will wait several months before  considering a repeat injection of the same type.  If you have any questions, please call [phone] [facility] Clinic[facility] R[facility] AND COM[facility]S  What are the risk, side effects and possible complications? Generally speaking, most procedures are safe.  However, with any procedure there are risks, side effects, and the possibility of complications.  The risks and complications are dependent upon the sites that are lesioned, or the type of nerve block to be performed.  The closer the procedure is to the spine, the more serious the risks are.  Great care is taken when placing the radio frequency needles, block needles or lesioning probes, but sometimes complications can occur. 1. Infection: Any time there is an injection through the skin, there is a risk of infection.  This is why sterile conditions are used for these blocks.  There are four possible types of infection. 1. Localized skin infection. 2. Central Nervous System Infection-This can be in the form of Meningitis, which can be deadly. 3. Epidural Infections-This can be in the form of an epidural abscess, which can cause pressure inside of the spine, causing compression of the spinal cord with subsequent paralysis. This would require an emergency surgery to decompress, and there are no guarantees that the patient would recover from the paralysis. 4. Discitis-This is an infection of the intervertebral discs.  It occurs in about 1% of discography procedures.  It is  difficult to treat and it Price lead to surgery.        2. Pain: the needles have to go through skin and soft tissues, will cause soreness.       3. Damage to internal structures:  The nerves to be lesioned Upton be near blood vessels or    other nerves which can be potentially damaged.       4. Bleeding: Bleeding is more common if the patient is taking blood thinners such as  aspirin, Coumadin, Ticiid, Plavix, etc., or if he/she have some genetic  predisposition  such as hemophilia. Bleeding into the spinal canal can cause compression of the spinal  cord with subsequent paralysis.  This would require an emergency surgery to  decompress and there are no guarantees that the patient would recover from the  paralysis.       5. Pneumothorax:  Puncturing of a lung is a possibility, every time a needle is introduced in  the area of the chest or upper back.  Pneumothorax refers to free air around the  collapsed lung(s), inside of the thoracic cavity (chest cavity).  Another two possible  complications related to a similar event would include: Hemothorax and Chylothorax.   These are variations of the Pneumothorax, where instead of air around the collapsed  lung(s), you Rathke have blood or chyle, respectively.       6. Spinal headaches: They Oftedahl occur with any procedures in the area of the spine.       7. Persistent CSF (Cerebro-Spinal Fluid) leakage: This is a rare problem, but Kerper occur  with prolonged intrathecal or epidural catheters either due to the formation of a fistulous  track or a dural tear.       8. Nerve damage: By working so close to the spinal cord, there is always a possibility of  nerve damage, which could be as serious as a permanent spinal cord injury with  paralysis.       9. Death:  Although rare, severe deadly allergic reactions known as "Anaphylactic  reaction" can occur to any of the medications used.      10. Worsening of the symptoms:  We can always make thing worse.  What are the chances of something like this happening? Chances of any of this occuring are extremely low.  By statistics, you have more of a chance of getting killed in a motor vehicle accident: while driving to the hospital than any of the above occurring .  Nevertheless, you should be aware that they are possibilities.  In general, it is similar to taking a shower.  Everybody knows that you can slip, hit your head and get killed.  Does that mean that you should not  shower again?  Nevertheless always keep in mind that statistics do not mean anything if you happen to be on the wrong side of them.  Even if a procedure has a 1 (one) in a 1,000,000 (million) chance of going wrong, it you happen to be that one..Also, keep in mind that by statistics, you have more of a chance of having something go wrong when taking medications.  Who should not have this procedure? If you are on a blood thinning medication (e.g. Coumadin, Plavix, see list of "Blood Thinners"), or if you have an active infection going on, you should not have the procedure.  If you are taking any blood thinners, please inform your physician.  How should I prepare for this procedure?  Do not  eat or drink anything at least six hours prior to the procedure.  Bring a driver with you .  It cannot be a taxi.  Come accompanied by an adult that can drive you back, and that is strong enough to help you if your legs get weak or numb from the local anesthetic.  Take all of your medicines the morning of the procedure with just enough water to swallow them.  If you have diabetes, make sure that you are scheduled to have your procedure done first thing in the morning, whenever possible.  If you have diabetes, take only half of your insulin dose and notify our nurse that you have done so as soon as you arrive at the clinic.  If you are diabetic, but only take blood sugar pills (oral hypoglycemic), then do not take them on the morning of your procedure.  You Smelcer take them after you have had the procedure.  Do not take aspirin or any aspirin-containing medications, at least eleven (11) days prior to the procedure.  They Nalepa prolong bleeding.  Wear loose fitting clothing that Mccullum be easy to take off and that you would not mind if it got stained with Betadine or blood.  Do not wear any jewelry or perfume  Remove any nail coloring.  It will interfere with some of our monitoring equipment.  NOTE: Remember that  this is not meant to be interpreted as a complete list of all possible complications.  Unforeseen problems Crossen occur.  BLOOD THINNERS The following drugs contain aspirin or other products, which can cause increased bleeding during surgery and should not be taken for 2 weeks prior to and 1 week after surgery.  If you should need take something for relief of minor pain, you Votaw take acetaminophen which is found in Tylenol,m Datril, Anacin-3 and Panadol. It is not blood thinner. The products listed below are.  Do not take any of the products listed below in addition to any listed on your instruction sheet.  A.P.C or A.P.C with Codeine Codeine Phosphate Capsules #3 Ibuprofen Ridaura  ABC compound Congesprin Imuran rimadil  Advil Cope Indocin Robaxisal  Alka-Seltzer Effervescent Pain Reliever and Antacid Coricidin or Coricidin-D  Indomethacin Rufen  Alka-Seltzer plus Cold Medicine Cosprin Ketoprofen S-A-C Tablets  Anacin Analgesic Tablets or Capsules Coumadin Korlgesic Salflex  Anacin Extra Strength Analgesic tablets or capsules CP-2 Tablets Lanoril Salicylate  Anaprox Cuprimine Capsules Levenox Salocol  Anexsia-D Dalteparin Magan Salsalate  Anodynos Darvon compound Magnesium Salicylate Sine-off  Ansaid Dasin Capsules Magsal Sodium Salicylate  Anturane Depen Capsules Marnal Soma  APF Arthritis pain formula Dewitt's Pills Measurin Stanback  Argesic Dia-Gesic Meclofenamic Sulfinpyrazone  Arthritis Bayer Timed Release Aspirin Diclofenac Meclomen Sulindac  Arthritis pain formula Anacin Dicumarol Medipren Supac  Analgesic (Safety coated) Arthralgen Diffunasal Mefanamic Suprofen  Arthritis Strength Bufferin Dihydrocodeine Mepro Compound Suprol  Arthropan liquid Dopirydamole Methcarbomol with Aspirin Synalgos  ASA tablets/Enseals Disalcid Micrainin Tagament  Ascriptin Doan's Midol Talwin  Ascriptin A/D Dolene Mobidin Tanderil  Ascriptin Extra Strength Dolobid Moblgesic Ticlid  Ascriptin with Codeine  Doloprin or Doloprin with Codeine Momentum Tolectin  Asperbuf Duoprin Mono-gesic Trendar  Aspergum Duradyne Motrin or Motrin IB Triminicin  Aspirin plain, buffered or enteric coated Durasal Myochrisine Trigesic  Aspirin Suppositories Easprin Nalfon Trillsate  Aspirin with Codeine Ecotrin Regular or Extra Strength Naprosyn Uracel  Atromid-S Efficin Naproxen Ursinus  Auranofin Capsules Elmiron Neocylate Vanquish  Axotal Emagrin Norgesic Verin  Azathioprine Empirin or Empirin with Codeine Normiflo Vitamin E  Azolid  Emprazil Nuprin Voltaren  Bayer Aspirin plain, buffered or children's or timed BC Tablets or powders Encaprin Orgaran Warfarin Sodium  Buff-a-Comp Enoxaparin Orudis Zorpin  Buff-a-Comp with Codeine Equegesic Os-Cal-Gesic   Buffaprin Excedrin plain, buffered or Extra Strength Oxalid   Bufferin Arthritis Strength Feldene Oxphenbutazone   Bufferin plain or Extra Strength Feldene Capsules Oxycodone with Aspirin   Bufferin with Codeine Fenoprofen Fenoprofen Pabalate or Pabalate-SF   Buffets II Flogesic Panagesic   Buffinol plain or Extra Strength Florinal or Florinal with Codeine Panwarfarin   Buf-Tabs Flurbiprofen Penicillamine   Butalbital Compound Four-way cold tablets Penicillin   Butazolidin Fragmin Pepto-Bismol   Carbenicillin Geminisyn Percodan   Carna Arthritis Reliever Geopen Persantine   Carprofen Gold's salt Persistin   Chloramphenicol Goody's Phenylbutazone   Chloromycetin Haltrain Piroxlcam   Clmetidine heparin Plaquenil   Cllnoril Hyco-pap Ponstel   Clofibrate Hydroxy chloroquine Propoxyphen         Before stopping any of these medications, be sure to consult the physician who ordered them.  Some, such as Coumadin (Warfarin) are ordered to prevent or treat serious conditions such as "deep thrombosis", "pumonary embolisms", and other heart problems.  The amount of time that you Spray need off of the medication Vigorito also vary with the medication and the reason for which  you were taking it.  If you are taking any of these medications, please make sure you notify your pain physician before you undergo any procedures.

## 2017-05-02 NOTE — Progress Notes (Signed)
Safety precautions to be maintained throughout the outpatient stay will include: orient to surroundings, keep bed in low position, maintain call bell within reach at all times, provide assistance with transfer out of bed and ambulation.  

## 2017-05-10 ENCOUNTER — Ambulatory Visit: Payer: BLUE CROSS/BLUE SHIELD | Admitting: Pain Medicine

## 2017-05-17 ENCOUNTER — Ambulatory Visit (HOSPITAL_BASED_OUTPATIENT_CLINIC_OR_DEPARTMENT_OTHER): Payer: BLUE CROSS/BLUE SHIELD | Admitting: Pain Medicine

## 2017-05-17 ENCOUNTER — Encounter: Payer: Self-pay | Admitting: Pain Medicine

## 2017-05-17 ENCOUNTER — Other Ambulatory Visit: Payer: Self-pay

## 2017-05-17 ENCOUNTER — Ambulatory Visit
Admission: RE | Admit: 2017-05-17 | Discharge: 2017-05-17 | Disposition: A | Discharge status: Home / Self Care | Payer: BLUE CROSS/BLUE SHIELD | Source: Ambulatory Visit | Attending: Pain Medicine | Admitting: Pain Medicine

## 2017-05-17 VITALS — BP 111/58 | HR 88 | Temp 97.7°F | Resp 12 | Ht 62.0 in | Wt 204.0 lb

## 2017-05-17 DIAGNOSIS — M48061 Spinal stenosis, lumbar region without neurogenic claudication: Secondary | ICD-10-CM | POA: Diagnosis not present

## 2017-05-17 DIAGNOSIS — M79605 Pain in left leg: Secondary | ICD-10-CM

## 2017-05-17 DIAGNOSIS — G8929 Other chronic pain: Secondary | ICD-10-CM | POA: Insufficient documentation

## 2017-05-17 DIAGNOSIS — M5126 Other intervertebral disc displacement, lumbar region: Secondary | ICD-10-CM

## 2017-05-17 DIAGNOSIS — M5116 Intervertebral disc disorders with radiculopathy, lumbar region: Secondary | ICD-10-CM | POA: Insufficient documentation

## 2017-05-17 DIAGNOSIS — M79671 Pain in right foot: Secondary | ICD-10-CM | POA: Diagnosis present

## 2017-05-17 DIAGNOSIS — M4726 Other spondylosis with radiculopathy, lumbar region: Secondary | ICD-10-CM | POA: Insufficient documentation

## 2017-05-17 DIAGNOSIS — M5416 Radiculopathy, lumbar region: Secondary | ICD-10-CM | POA: Diagnosis not present

## 2017-05-17 DIAGNOSIS — R208 Other disturbances of skin sensation: Secondary | ICD-10-CM | POA: Diagnosis not present

## 2017-05-17 DIAGNOSIS — M79604 Pain in right leg: Secondary | ICD-10-CM | POA: Insufficient documentation

## 2017-05-17 DIAGNOSIS — M79672 Pain in left foot: Secondary | ICD-10-CM | POA: Insufficient documentation

## 2017-05-17 MED ORDER — FENTANYL CITRATE (PF) 100 MCG/2ML IJ SOLN
25.0000 ug | INTRAMUSCULAR | Status: DC | PRN
  Administered 2017-05-17: 100 ug via INTRAVENOUS
  Filled 2017-05-17: qty 2

## 2017-05-17 MED ORDER — ROPIVACAINE HCL 2 MG/ML IJ SOLN
INTRAMUSCULAR | Status: AC
  Filled 2017-05-17: qty 10

## 2017-05-17 MED ORDER — LIDOCAINE HCL 2 % IJ SOLN
INTRAMUSCULAR | Status: AC
  Filled 2017-05-17: qty 20

## 2017-05-17 MED ORDER — MIDAZOLAM HCL 5 MG/5ML IJ SOLN
1.0000 mg | INTRAMUSCULAR | Status: DC | PRN
  Administered 2017-05-17: 3 mg via INTRAVENOUS

## 2017-05-17 MED ORDER — TRIAMCINOLONE ACETONIDE 40 MG/ML IJ SUSP
40.0000 mg | Freq: Once | INTRAMUSCULAR | Status: AC
  Administered 2017-05-17: 40 mg

## 2017-05-17 MED ORDER — LIDOCAINE HCL 2 % IJ SOLN
10.0000 mL | Freq: Once | INTRAMUSCULAR | Status: AC
  Administered 2017-05-17: 800 mg

## 2017-05-17 MED ORDER — TRIAMCINOLONE ACETONIDE 40 MG/ML IJ SUSP
INTRAMUSCULAR | Status: AC
  Filled 2017-05-17: qty 1

## 2017-05-17 MED ORDER — LACTATED RINGERS IV SOLN
1000.0000 mL | Freq: Once | INTRAVENOUS | Status: AC
  Administered 2017-05-17: 1000 mL via INTRAVENOUS

## 2017-05-17 MED ORDER — IOPAMIDOL (ISOVUE-M 200) INJECTION 41%
10.0000 mL | Freq: Once | INTRAMUSCULAR | Status: AC
  Administered 2017-05-17: 10 mL via EPIDURAL
  Filled 2017-05-17: qty 10

## 2017-05-17 MED ORDER — SODIUM CHLORIDE 0.9% FLUSH
2.0000 mL | Freq: Once | INTRAVENOUS | Status: AC
  Administered 2017-05-17: 10 mL

## 2017-05-17 MED ORDER — SODIUM CHLORIDE 0.9 % IJ SOLN
INTRAMUSCULAR | Status: AC
  Filled 2017-05-17: qty 10

## 2017-05-17 MED ORDER — ROPIVACAINE HCL 2 MG/ML IJ SOLN: 2.0000 mL | Freq: Once | INTRAMUSCULAR | Status: DC

## 2017-05-17 MED ORDER — MIDAZOLAM HCL 5 MG/5ML IJ SOLN
INTRAMUSCULAR | Status: AC
  Filled 2017-05-17: qty 5

## 2017-05-17 NOTE — Patient Instructions (Signed)
____________________________________________________________________________________________  Post-Procedure instructions Instructions:  Apply ice: Fill a plastic sandwich bag with crushed ice. Cover it with a small towel and apply to injection site. Apply for 15 minutes then remove x 15 minutes. Repeat sequence on day of procedure, until you go to bed. The purpose is to minimize swelling and discomfort after procedure.  Apply heat: Apply heat to procedure site starting the day following the procedure. The purpose is to treat any soreness and discomfort from the procedure.  Food intake: Start with clear liquids (like water) and advance to regular food, as tolerated.   Physical activities: Keep activities to a minimum for the first 8 hours after the procedure.   Driving: If you have received any sedation, you are not allowed to drive for 24 hours after your procedure.  Blood thinner: Restart your blood thinner 6 hours after your procedure. (Only for those taking blood thinners)  Insulin: As soon as you can eat, you Levitz resume your normal dosing schedule. (Only for those taking insulin)  Infection prevention: Keep procedure site clean and dry.  Post-procedure Pain Diary: Extremely important that this be done correctly and accurately. Recorded information will be used to determine the next step in treatment.  Pain evaluated is that of treated area only. Do not include pain from an untreated area.  Complete every hour, on the hour, for the initial 8 hours. Set an alarm to help you do this part accurately.  Do not go to sleep and have it completed later. It will not be accurate.  Follow-up appointment: Keep your follow-up appointment after the procedure. Usually 2 weeks for most procedures. (6 weeks in the case of radiofrequency.) Bring you pain diary.  Expect:  From numbing medicine (AKA: Local Anesthetics): Numbness or decrease in pain.  Onset: Full effect within 15 minutes of  injected.  Duration: It will depend on the type of local anesthetic used. On the average, 1 to 8 hours.   From steroids: Decrease in swelling or inflammation. Once inflammation is improved, relief of the pain will follow.  Onset of benefits: Depends on the amount of swelling present. The more swelling, the longer it will take for the benefits to be seen. In some cases, up to 10 days.  Duration: Steroids will stay in the system x 2 weeks. Duration of benefits will depend on multiple posibilities including persistent irritating factors.  From procedure: Some discomfort is to be expected once the numbing medicine wears off. This should be minimal if ice and heat are applied as instructed. Call if:  You experience numbness and weakness that gets worse with time, as opposed to wearing off.  New onset bowel or bladder incontinence. (Spinal procedures only)  Emergency Numbers:  Durning business hours (Monday - Thursday, 8:00 AM - 4:00 PM) (Friday, 9:00 AM - 12:00 Noon): [phone]  After hours: [phone] ____________________________________________________________________________________________    

## 2017-05-17 NOTE — Progress Notes (Signed)
Patient's Name: Chelsea [name]  MRN: [id number]  Referring Provider: Delano Metz, MD  DOB: Jun 24, 1960  PCP: Jerl Mina, MD  DOS: 05/17/2017  Note by: Oswaldo Done, MD  Service setting: Ambulatory outpatient  Specialty: Interventional Pain Management  Patient type: Established  Location: [facility] (AMB) Pain Management Facility  Visit type: Interventional Procedure   Primary Reason for Visit: Interventional Pain Management Treatment. CC: Foot Pain (bilateral)  Procedure:       Anesthesia, Analgesia, Anxiolysis:  Type: Therapeutic Inter-Laminar Epidural Steroid Injection #3  Region: Lumbar Level: L5-S1 Level. Laterality: Right-Sided Paramedial  Type: Local Anesthesia with Moderate (Conscious) Sedation Local Anesthetic: Lidocaine 1% Route: Intravenous (IV) IV Access: Secured Sedation: Meaningful verbal contact was maintained at all times during the procedure  Indication(s): Analgesia and Anxiety   Indications: 1. Chronic L5 & S1 Lumbar radiculitis (Bilateral) (R>L)   2. Chronic lower extremity pain (Primary Area of Pain) (Bilateral) (R>L)   3. L4-5 Lumbar foraminal disc protrusion (Right)   4. Lumbar spondylosis with radiculitis (Bilateral) (L5)   5. 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 Right S1 Hyperalgesia   7. Chronic L5 Lumbar radiculitis (Bilateral) (R>L)   8. Lumbar radiculitis   9. Chronic pain of lower extremity, bilateral   10. Protrusion of lumbar intervertebral disc   11. Other spondylosis with radiculopathy, lumbar region   12. Lumbar foraminal stenosis   13. Hyperalgesia    Pain Score: Pre-procedure: 2 /10 Post-procedure: 0-No pain/10  Pre-op Assessment:  Chelsea [name] is a 57 y.o. (year old), female patient, seen today for interventional treatment. She  has a past surgical history that includes Abdominal hysterectomy; Tubal ligation; and nose. Chelsea [name] has a current medication list which includes the following prescription(s): acetaminophen,  amitriptyline, aspirin-acetaminophen, citalopram, diphenhydramine-apap (sleep), furosemide, gabapentin, ibuprofen, lorazepam, losartan, naproxen sodium, omega-3 fatty acids, and aspirin-acetaminophen-caffeine, and the following Facility-Administered Medications: fentanyl, midazolam, and ropivacaine (pf) 2 mg/ml (0.2%). Her primarily concern today is the Foot Pain (bilateral)  Initial Vital Signs:  Pulse Rate: 88 Temp: 98.2 F (36.8 C) Resp: 18 BP: (!) 109/54 SpO2: 100 %  BMI: Estimated body mass index is 37.31 kg/m as calculated from the following:   Height as of this encounter: 5\' 2"  (1.575 m).   Weight as of this encounter: 204 lb (92.5 kg).  Risk Assessment: Allergies: Reviewed. She is allergic to codeine.  Allergy Precautions: None required Coagulopathies: Reviewed. None identified.  Blood-thinner therapy: None at this time Active Infection(s): Reviewed. None identified. Chelsea [name] is afebrile  Site Confirmation: Chelsea [name] was asked to confirm the procedure and laterality before marking the site Procedure checklist: Completed Consent: Before the procedure and under the influence of no sedative(s), amnesic(s), or anxiolytics, the patient was informed of the treatment options, risks and possible complications. To fulfill our ethical and legal obligations, as recommended by the American Medical Association's Code of Ethics, I have informed the patient of my clinical impression; the nature and purpose of the treatment or procedure; the risks, benefits, and possible complications of the intervention; the alternatives, including doing nothing; the risk(s) and benefit(s) of the alternative treatment(s) or procedure(s); and the risk(s) and benefit(s) of doing nothing. The patient was provided information about the general risks and possible complications associated with the procedure. These [place] include, but are not limited to: failure to achieve desired goals, infection, bleeding, organ or nerve  damage, allergic reactions, paralysis, and death. In addition, the patient was informed of those risks and complications associated to [facility]  procedures, such as failure to decrease pain; infection (i.e.: Meningitis, epidural or intraspinal abscess); bleeding (i.e.: epidural hematoma, subarachnoid hemorrhage, or any other type of intraspinal or peri-dural bleeding); organ or nerve damage (i.e.: Any type of peripheral nerve, nerve root, or spinal cord injury) with subsequent damage to sensory, motor, and/or autonomic systems, resulting in permanent pain, numbness, and/or weakness of one or several areas of the body; allergic reactions; (i.e.: anaphylactic reaction); and/or death. Furthermore, the patient was informed of those risks and complications associated with the medications. These include, but are not limited to: allergic reactions (i.e.: anaphylactic or anaphylactoid reaction(s)); adrenal axis suppression; blood sugar elevation that in diabetics Heo result in ketoacidosis or comma; water retention that in patients with history of congestive heart failure Giesler result in shortness of breath, pulmonary edema, and decompensation with resultant heart failure; weight gain; swelling or edema; medication-induced neural toxicity; particulate matter embolism and blood vessel occlusion with resultant organ, and/or nervous system infarction; and/or aseptic necrosis of one or more joints. Finally, the patient was informed that Medicine is not an exact science; therefore, there is also the possibility of unforeseen or unpredictable risks and/or possible complications that Barcus result in a catastrophic outcome. The patient indicated having understood very clearly. We have given the patient no guarantees and we have made no promises. Enough time was given to the patient to ask questions, all of which were answered to the patient's satisfaction. Chelsea [name] has indicated that she wanted to continue with the  procedure. Attestation: I, the ordering provider, attest that I have discussed with the patient the benefits, risks, side-effects, alternatives, likelihood of achieving goals, and potential problems during recovery for the procedure that I have provided informed consent. Date  Time: 05/17/2017  8:21 AM  Pre-Procedure Preparation:  Monitoring: As per clinic protocol. Respiration, ETCO2, SpO2, BP, heart rate and rhythm monitor placed and checked for adequate function Safety Precautions: Patient was assessed for positional comfort and pressure points before starting the procedure. Time-out: I initiated and conducted the "Time-out" before starting the procedure, as per protocol. The patient was asked to participate by confirming the accuracy of the "Time Out" information. Verification of the correct person, site, and procedure were performed and confirmed by me, the nursing staff, and the patient. "Time-out" conducted as per Joint Commission's Universal Protocol (UP.01.01.01). "Time-out" Date & Time: 05/17/2017; 0915 hrs.  Description of Procedure:       Position: Prone with head of the table was raised to facilitate breathing. Target Area: The interlaminar space, initially targeting the lower laminar border of the superior vertebral body. Approach: Paramedial approach. Area Prepped: Entire Posterior Lumbar Region Prepping solution: ChloraPrep (2% chlorhexidine gluconate and 70% isopropyl alcohol) Safety Precautions: Aspiration looking for blood return was conducted prior to all injections. At no point did we inject any substances, as a needle was being advanced. No attempts were made at seeking any paresthesias. Safe injection practices and needle disposal techniques used. Medications properly checked for expiration dates. SDV (single dose vial) medications used. Description of the Procedure: Protocol guidelines were followed. The procedure needle was introduced through the skin, ipsilateral to the  reported pain, and advanced to the target area. Bone was contacted and the needle walked caudad, until the lamina was cleared. The epidural space was identified using "loss-of-resistance technique" with 2-3 ml of PF-NaCl (0.9% NSS), in a 5cc LOR glass syringe. Vitals:   05/17/17 0926 05/17/17 0934 05/17/17 0944 05/17/17 0955  BP: 114/71 104/69 (!) 107/57 (!) 111/58  Pulse:      Resp: 13 10 19 12   Temp:  97.7 F (36.5 C)    TempSrc:  Temporal    SpO2: 93% 93% 93% 96%  Weight:      Height:        Start Time: 0915 hrs. End Time: 0924 hrs. Materials:  Needle(s) Type: Epidural needle Gauge: 17G Length: 3.5-in Medication(s): Please see orders for medications and dosing details.  Imaging Guidance (Spinal):  Type of Imaging Technique: Fluoroscopy Guidance (Spinal) Indication(s): Assistance in needle guidance and placement for procedures requiring needle placement in or near specific anatomical locations not easily accessible without such assistance. Exposure Time: Please see nurses notes. Contrast: Before injecting any contrast, we confirmed that the patient did not have an allergy to iodine, shellfish, or radiological contrast. Once satisfactory needle placement was completed at the desired level, radiological contrast was injected. Contrast injected under live fluoroscopy. No contrast complications. See chart for type and volume of contrast used. Fluoroscopic Guidance: I was personally present during the use of fluoroscopy. "Tunnel Vision Technique" used to obtain the best possible view of the target area. Parallax error corrected before commencing the procedure. "Direction-depth-direction" technique used to introduce the needle under continuous pulsed fluoroscopy. Once target was reached, antero-posterior, oblique, and lateral fluoroscopic projection used confirm needle placement in all planes. Images permanently stored in EMR. Interpretation: I personally interpreted the imaging  intraoperatively. Adequate needle placement confirmed in multiple planes. Appropriate spread of contrast into desired area was observed. No evidence of afferent or efferent intravascular uptake. No intrathecal or subarachnoid spread observed. Permanent images saved into the patient's record.  Antibiotic Prophylaxis:   Anti-infectives (From admission, onward)   None     Indication(s): None identified  Post-operative Assessment:  Post-procedure Vital Signs:  Pulse Rate: 88 Temp: 97.7 F (36.5 C) Resp: 12 BP: (!) 111/58 SpO2: 96 %  EBL: None  Complications: No immediate post-treatment complications observed by team, or reported by patient.  Note: The patient tolerated the entire procedure well. A repeat set of vitals were taken after the procedure and the patient was kept under observation following institutional policy, for this type of procedure. Post-procedural neurological assessment was performed, showing return to baseline, prior to discharge. The patient was provided with post-procedure discharge instructions, including a section on how to identify potential problems. Should any problems arise concerning this procedure, the patient was given instructions to immediately contact us, at any time, without hesitation. In any case, we plan to contact the patient by telephone for a follow-up status report regarding this interventional procedure.  Comments:  No additional relevant information.  Plan of Care    Imaging Orders     DG C-Arm 1-60 Min-No Report  Procedure Orders     Lumbar Epidural Injection  Medications ordered for procedure: Meds ordered this encounter  Medications  . midazolam (VERSED) 5 MG/5ML injection 1-2 mg    Make sure Flumazenil is available in the pyxis when using this medication. If oversedation occurs, administer 0.2 mg IV over 15 sec. If after 45 sec no response, administer 0.2 mg again over 1 min; Sandstrom repeat at 1 min intervals; not to exceed 4 doses (1  mg)  . fentaNYL (SUBLIMAZE) injection 25-50 mcg    Make sure Narcan is available in the pyxis when using this medication. In the event of respiratory depression (RR< 8/min): Titrate NARCAN (naloxone) in increments of 0.1 to 0.2 mg IV at 2-3 minute intervals, until desired degree of reversal.  .  lactated ringers infusion 1,000 mL  . iopamidol (ISOVUE-M) 41 % intrathecal injection 10 mL    Must be Myelogram-compatible. If not available, you Peretz substitute with a water-soluble, non-ionic, hypoallergenic, myelogram-compatible radiological contrast medium.  Marland Kitchen lidocaine (XYLOCAINE) 2 % (with pres) injection 200 mg  . sodium chloride flush (NS) 0.9 % injection 2 mL  . ropivacaine (PF) 2 mg/mL (0.2%) (NAROPIN) injection 2 mL  . triamcinolone acetonide (KENALOG-40) injection 40 mg   Medications administered: We administered midazolam, fentaNYL, lactated ringers, iopamidol, lidocaine, sodium chloride flush, and triamcinolone acetonide.  See the medical record for exact dosing, route, and time of administration.  New Prescriptions   No medications on file   Disposition: Discharge home  Discharge Date & Time: 05/17/2017; 0958 hrs.   Physician-requested Follow-up: Return for post-procedure eval (2 wks), w/ Dr. Laban Emperor.  Future Appointments  Date Time Provider Department Center  05/30/2017  9:15 AM Delano Metz, MD [facility] None   Primary Care Physician: Jerl Mina, MD Location: [facility] Note by: Oswaldo Done, MD Date: 05/17/2017; Time: 10:26 AM  Disclaimer:  Medicine is not an exact science. The only guarantee in medicine is that nothing is guaranteed. It is important to note that the decision to proceed with this intervention was based on the information collected from the patient. The Data and conclusions were drawn from the patient's questionnaire, the interview, and the physical examination. Because the information was provided in large part by  the patient, it cannot be guaranteed that it has not been purposely or unconsciously manipulated. Every effort has been made to obtain as much relevant data as possible for this evaluation. It is important to note that the conclusions that lead to this procedure are derived in large part from the available data. Always take into account that the treatment will also be dependent on availability of resources and existing treatment guidelines, considered by other Pain Management Practitioners as being common knowledge and practice, at the time of the intervention. For Medico-Legal purposes, it is also important to point out that variation in procedural techniques and pharmacological choices are the acceptable norm. The indications, contraindications, technique, and results of the above procedure should only be interpreted and judged by a Board-Certified Interventional Pain Specialist with extensive familiarity and expertise in the same exact procedure and technique.

## 2017-05-18 ENCOUNTER — Telehealth: Payer: Self-pay

## 2017-05-18 NOTE — Telephone Encounter (Signed)
Post procedure phone call.  Left message.  

## 2017-05-30 ENCOUNTER — Ambulatory Visit: Payer: BLUE CROSS/BLUE SHIELD | Attending: Pain Medicine | Admitting: Pain Medicine

## 2017-05-30 ENCOUNTER — Other Ambulatory Visit: Payer: Self-pay

## 2017-05-30 ENCOUNTER — Encounter: Payer: Self-pay | Admitting: Pain Medicine

## 2017-05-30 VITALS — BP 152/94 | HR 83 | Temp 98.6°F | Resp 16 | Ht 62.0 in | Wt 209.0 lb

## 2017-05-30 DIAGNOSIS — E782 Mixed hyperlipidemia: Secondary | ICD-10-CM | POA: Diagnosis not present

## 2017-05-30 DIAGNOSIS — Z885 Allergy status to narcotic agent status: Secondary | ICD-10-CM | POA: Insufficient documentation

## 2017-05-30 DIAGNOSIS — Z7982 Long term (current) use of aspirin: Secondary | ICD-10-CM | POA: Diagnosis not present

## 2017-05-30 DIAGNOSIS — M79672 Pain in left foot: Secondary | ICD-10-CM | POA: Insufficient documentation

## 2017-05-30 DIAGNOSIS — M5126 Other intervertebral disc displacement, lumbar region: Secondary | ICD-10-CM | POA: Diagnosis not present

## 2017-05-30 DIAGNOSIS — M5441 Lumbago with sciatica, right side: Secondary | ICD-10-CM

## 2017-05-30 DIAGNOSIS — M48061 Spinal stenosis, lumbar region without neurogenic claudication: Secondary | ICD-10-CM | POA: Insufficient documentation

## 2017-05-30 DIAGNOSIS — F329 Major depressive disorder, single episode, unspecified: Secondary | ICD-10-CM | POA: Insufficient documentation

## 2017-05-30 DIAGNOSIS — M5416 Radiculopathy, lumbar region: Secondary | ICD-10-CM | POA: Diagnosis not present

## 2017-05-30 DIAGNOSIS — M5442 Lumbago with sciatica, left side: Secondary | ICD-10-CM | POA: Diagnosis not present

## 2017-05-30 DIAGNOSIS — M79604 Pain in right leg: Secondary | ICD-10-CM | POA: Diagnosis not present

## 2017-05-30 DIAGNOSIS — M4726 Other spondylosis with radiculopathy, lumbar region: Secondary | ICD-10-CM | POA: Diagnosis not present

## 2017-05-30 DIAGNOSIS — E559 Vitamin D deficiency, unspecified: Secondary | ICD-10-CM | POA: Insufficient documentation

## 2017-05-30 DIAGNOSIS — G629 Polyneuropathy, unspecified: Secondary | ICD-10-CM | POA: Diagnosis not present

## 2017-05-30 DIAGNOSIS — G609 Hereditary and idiopathic neuropathy, unspecified: Secondary | ICD-10-CM | POA: Diagnosis not present

## 2017-05-30 DIAGNOSIS — I10 Essential (primary) hypertension: Secondary | ICD-10-CM | POA: Insufficient documentation

## 2017-05-30 DIAGNOSIS — Z79899 Other long term (current) drug therapy: Secondary | ICD-10-CM | POA: Insufficient documentation

## 2017-05-30 DIAGNOSIS — G894 Chronic pain syndrome: Secondary | ICD-10-CM | POA: Insufficient documentation

## 2017-05-30 DIAGNOSIS — G4733 Obstructive sleep apnea (adult) (pediatric): Secondary | ICD-10-CM | POA: Insufficient documentation

## 2017-05-30 DIAGNOSIS — M5136 Other intervertebral disc degeneration, lumbar region: Secondary | ICD-10-CM | POA: Diagnosis not present

## 2017-05-30 DIAGNOSIS — M792 Neuralgia and neuritis, unspecified: Secondary | ICD-10-CM

## 2017-05-30 DIAGNOSIS — R208 Other disturbances of skin sensation: Secondary | ICD-10-CM | POA: Insufficient documentation

## 2017-05-30 DIAGNOSIS — M160 Bilateral primary osteoarthritis of hip: Secondary | ICD-10-CM | POA: Insufficient documentation

## 2017-05-30 DIAGNOSIS — Z79891 Long term (current) use of opiate analgesic: Secondary | ICD-10-CM | POA: Diagnosis not present

## 2017-05-30 DIAGNOSIS — M79674 Pain in right toe(s): Secondary | ICD-10-CM | POA: Diagnosis not present

## 2017-05-30 DIAGNOSIS — F419 Anxiety disorder, unspecified: Secondary | ICD-10-CM | POA: Insufficient documentation

## 2017-05-30 DIAGNOSIS — M79605 Pain in left leg: Secondary | ICD-10-CM

## 2017-05-30 DIAGNOSIS — M79671 Pain in right foot: Secondary | ICD-10-CM | POA: Diagnosis not present

## 2017-05-30 DIAGNOSIS — G5790 Unspecified mononeuropathy of unspecified lower limb: Secondary | ICD-10-CM | POA: Diagnosis not present

## 2017-05-30 DIAGNOSIS — M79675 Pain in left toe(s): Secondary | ICD-10-CM

## 2017-05-30 DIAGNOSIS — M5116 Intervertebral disc disorders with radiculopathy, lumbar region: Secondary | ICD-10-CM | POA: Insufficient documentation

## 2017-05-30 DIAGNOSIS — F17210 Nicotine dependence, cigarettes, uncomplicated: Secondary | ICD-10-CM | POA: Diagnosis not present

## 2017-05-30 DIAGNOSIS — M9983 Other biomechanical lesions of lumbar region: Secondary | ICD-10-CM

## 2017-05-30 DIAGNOSIS — G8929 Other chronic pain: Secondary | ICD-10-CM

## 2017-05-30 MED ORDER — GABAPENTIN 600 MG PO TABS: 600.0000 mg | ORAL_TABLET | Freq: Three times a day (TID) | ORAL | 2 refills | Status: AC

## 2017-05-30 NOTE — Patient Instructions (Signed)

## 2017-05-30 NOTE — Progress Notes (Signed)
Patient's Name: Chelsea [name]  MRN: [id number]  Referring Provider: Maryland Pink, MD  DOB: 1960/10/02  PCP: Maryland Pink, MD  DOS: 05/30/2017  Note by: Gaspar Cola, MD  Service setting: Ambulatory outpatient  Specialty: Interventional Pain Management  Location: [facility] (AMB) Pain Management Facility    Patient type: Established   Primary Reason(s) for Visit: Encounter for post-procedure evaluation of chronic illness with mild to moderate exacerbation CC: Foot Pain (bilateral)  HPI  [name] is a 57 y.o. year old, female patient, who comes today for a post-procedure evaluation. 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); Lumbar facet syndrome (Bilateral) (R>L); Chronic hip pain (Bilateral) (R>L); Chronic L5 & S1 Lumbar radiculitis (Bilateral) (R>L); Lumbar facet arthropathy (Bilateral); Lumbar spondylosis with radiculitis (Bilateral) (L5); Osteoarthritis lumbar spine; Lumbar facet osteoarthritis (Bilateral); Osteoarthritis; Abnormal MRI, lumbar spine (11/27/2015); L4-5 Lumbar foraminal disc protrusion (Right); 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 Her primarily concern today is the Foot Pain (bilateral)  Pain Assessment: Location: Right, Left Foot Radiating: denies Onset: More than a  month ago Duration: Chronic pain Quality: Burning, Stabbing(hypersensitivity) Severity: 3 /10 (self-reported pain score)  Note: Reported level is compatible with observation.                         When using our objective Pain Scale, levels between 6 and 10/10 are said to belong in an emergency room, as it progressively worsens from a 6/10, described as severely limiting, requiring emergency care not usually available at an outpatient pain management facility. At a 6/10 level, communication becomes difficult and requires great effort. Assistance to reach the emergency department [place] be required. Facial flushing and profuse sweating along with potentially dangerous increases in heart rate and blood pressure will be evident. Timing: Constant Modifying factors: denies  [name] comes in today for post-procedure evaluation after the treatment done on 05/17/2017.  Today we spent quite a bit of time going over the results of her recent series of 3 lumbar epidural steroid injections, as well as counseling/coordination of care regarding: Ms. Melikian primary cause of pain, the treatment plan, treatment alternatives, the risks and possible complications of proposed treatment, the results, interpretation and significance of  her recent diagnostic interventional treatment(s), realistic expectations and the goals of pain management (increased in functionality).  Today I also spent quite a bit of time talking to the patient about spinal cord stimulator implants, trials, risks and possible complications, and requirements prior to trialing, such as medical psychology evaluation.  The patient has indicated being interested in the spinal cord stimulator implant and therefore today we have provided her with information regarding the device and we have arranged for the patient to have her medical psychology evaluation to see if she would be a good candidate for implant therapy.  Further details on both, my assessment(s), as  well as the proposed treatment plan, please see below.  Post-Procedure Assessment  05/17/2017 Procedure: Therapeutic right-sided L5-S1 interlaminar lumbar  epidural steroid injection #3 under fluoroscopic guidance and IV sedation Pre-procedure pain score:  2/10 Post-procedure pain score: 0/10 (100% relief) Influential Factors: BMI: 38.23 kg/m Intra-procedural challenges: None observed.         Assessment challenges: None detected.              Reported side-effects: None.        Post-procedural adverse reactions or complications: None reported         Sedation: Sedation provided. When no sedatives are used, the analgesic levels obtained are directly associated to the effectiveness of the local anesthetics. However, when sedation is provided, the level of analgesia obtained during the initial 1 hour following the intervention, is believed to be the result of a combination of factors. These factors Obey include, but are not limited to: 1. The effectiveness of the local anesthetics used. 2. The effects of the analgesic(s) and/or anxiolytic(s) used. 3. The degree of discomfort experienced by the patient at the time of the procedure. 4. The patients ability and reliability in recalling and recording the events. 5. The presence and influence of possible secondary gains and/or psychosocial factors. Reported result: Relief experienced during the 1st hour after the procedure: 100 % (Ultra-Short Term Relief)            Interpretative annotation: Clinically appropriate result. Analgesia during this period is likely to be Local Anesthetic and/or IV Sedative (Analgesic/Anxiolytic) related.          Effects of local anesthetic: The analgesic effects attained during this period are directly associated to the localized infiltration of local anesthetics and therefore cary significant diagnostic value as to the etiological location, or anatomical origin, of the pain. Expected duration of relief is directly  dependent on the pharmacodynamics of the local anesthetic used. Long-acting (4-6 hours) anesthetics used.  Reported result: Relief during the next 4 to 6 hour after the procedure: 100 % (Short-Term Relief)            Interpretative annotation: Clinically appropriate result. Analgesia during this period is likely to be Local Anesthetic-related.          Long-term benefit: Defined as the period of time past the expected duration of local anesthetics (1 hour for short-acting and 4-6 hours for long-acting). With the possible exception of prolonged sympathetic blockade from the local anesthetics, benefits during this period are typically attributed to, or associated with, other factors such as analgesic sensory neuropraxia, antiinflammatory effects, or beneficial biochemical changes provided by agents other than the local anesthetics.  Reported result: Extended relief following procedure: 100 %(lasting 1 week, then gradually returned to worse than pre procedure score) (Long-Term Relief)            Interpretative annotation: Clinically appropriate result. Good relief. No permanent benefit expected. Inflammation plays a part in the etiology to the pain.          Current benefits: Defined as reported results that persistent at this point in time.   Analgesia: 0-25 %            Function: Back to baseline ROM: Back to baseline Interpretative annotation: Recurrence of symptoms. Therapeutic failure. Results would suggest persistent aggravating factors.          Interpretation: Results would suggest failure of therapy in achieving desired goal(s). Possible adjustment in plan of care [place] be required          Plan:  Re-assessment of pain etiology.  Laboratory Chemistry  Inflammation Markers (CRP: Acute Phase) (ESR: Chronic Phase) Lab Results  Component Value Date   CRP 2.5 02/06/2017   ESRSEDRATE 15 02/06/2017                         Renal Function Markers Lab Results  Component Value  Date   BUN 15 02/06/2017   CREATININE 0.92 02/06/2017   GFRAA 82 02/06/2017   GFRNONAA 71 02/06/2017                 Hepatic Function Markers Lab Results  Component Value Date   AST 22 02/06/2017   ALBUMIN 4.3 02/06/2017   ALKPHOS 80 02/06/2017                 Electrolytes Lab Results  Component Value Date   NA 135 02/06/2017   K 4.2 02/06/2017   CL 95 (L) 02/06/2017   CALCIUM 9.7 02/06/2017   MG 1.9 02/06/2017                        Neuropathy Markers Lab Results  Component Value Date   VITAMINB12 1,523 (H) 02/06/2017                 Bone Pathology Markers Lab Results  Component Value Date   25OHVITD1 32 02/06/2017   25OHVITD2 1.5 02/06/2017   25OHVITD3 30 02/06/2017                         Coagulation Parameters Lab Results  Component Value Date   INR 0.9 04/30/2011   LABPROT 12.6 04/30/2011   APTT 28.7 04/30/2011   PLT 381 04/30/2011                 Cardiovascular Markers Lab Results  Component Value Date   HGB 14.3 04/30/2011   HCT 40.7 04/30/2011                 Note: Lab results reviewed.  Recent Diagnostic Imaging Results  DG C-Arm 1-60 Min-No Report Fluoroscopy was utilized by the requesting physician.  No radiographic  interpretation.   Complexity Note: I personally reviewed the fluoroscopic imaging of the procedure.                        Meds   Current Outpatient Medications:  .  acetaminophen (TYLENOL) 500 MG tablet, Take by mouth., Disp: , Rfl:  .  amitriptyline (ELAVIL) 10 MG tablet, TAKE 1 TO 2 TABLETS BY MOUTH AT BEDTIME, Disp: , Rfl:  .  Aspirin-Acetaminophen (GOODY BODY PAIN) 500-325 MG PACK, Take by mouth 2 (two) times daily as needed., Disp: , Rfl:  .  citalopram (CELEXA) 20 MG tablet, Take 20 mg daily by mouth., Disp: , Rfl: 3 .  Diphenhydramine-APAP, sleep, (GOODY PM PO), Take by mouth daily., Disp: , Rfl:  .  furosemide (LASIX) 20 MG tablet, Take by mouth., Disp: , Rfl:  .  gabapentin (NEURONTIN) 600 MG tablet, Take 1  tablet (600 mg total) by mouth every 8 (eight) hours., Disp: 90 tablet, Rfl: 2 .  ibuprofen (ADVIL,MOTRIN) 200 MG tablet, Take by mouth., Disp: , Rfl:  .  LORazepam (ATIVAN) 0.5 MG tablet, Take by mouth., Disp: , Rfl:  .  losartan (COZAAR) 100 MG tablet, Take 100 mg daily by mouth., Disp: , Rfl: 10 .  naproxen sodium (ALEVE) 220 MG tablet, Take  by mouth., Disp: , Rfl:  .  Omega-3 Fatty Acids (FISH OIL PO), Take by mouth., Disp: , Rfl:   ROS  Constitutional: Denies any fever or chills Gastrointestinal: No reported hemesis, hematochezia, vomiting, or acute GI distress Musculoskeletal: Denies any acute onset joint swelling, redness, loss of ROM, or weakness Neurological: No reported episodes of acute onset apraxia, aphasia, dysarthria, agnosia, amnesia, paralysis, loss of coordination, or loss of consciousness  Allergies  Ms. Normoyle is allergic to codeine.  PFSH  Drug: Ms. Jacquin  reports that she does not use drugs. Alcohol:  reports that she drinks alcohol. Tobacco:  reports that she has been smoking cigarettes.  She has a 39.00 pack-year smoking history. she has never used smokeless tobacco. Medical:  has a past medical history of Allergy, Anxiety, Depression, Hyperlipidemia, Hypertension, Sleep apnea, Vitamin B12 deficiency, and Vitamin D deficiency. Surgical: Ms. Ballentine  has a past surgical history that includes Abdominal hysterectomy; Tubal ligation; and nose. Family: family history includes Cancer in her father.  Constitutional Exam  General appearance: Well nourished, well developed, and well hydrated. In no apparent acute distress Vitals:   05/30/17 0927  BP: (!) 152/94  Pulse: 83  Resp: 16  Temp: 98.6 F (37 C)  SpO2: 100%  Weight: 209 lb (94.8 kg)  Height: '5\' 2"'$  (1.575 m)   BMI Assessment: Estimated body mass index is 38.23 kg/m as calculated from the following:   Height as of this encounter: '5\' 2"'$  (1.575 m).   Weight as of this encounter: 209 lb (94.8 kg).  BMI  interpretation table: BMI level Category Range association with higher incidence of chronic pain  <18 kg/m2 Underweight   18.5-24.9 kg/m2 Ideal body weight   25-29.9 kg/m2 Overweight Increased incidence by 20%  30-34.9 kg/m2 Obese (Class I) Increased incidence by 68%  35-39.9 kg/m2 Severe obesity (Class II) Increased incidence by 136%  >40 kg/m2 Extreme obesity (Class III) Increased incidence by 254%   BMI Readings from Last 4 Encounters:  05/30/17 38.23 kg/m  05/17/17 37.31 kg/m  05/02/17 37.62 kg/m  04/12/17 37.62 kg/m   Wt Readings from Last 4 Encounters:  05/30/17 209 lb (94.8 kg)  05/17/17 204 lb (92.5 kg)  05/02/17 209 lb (94.8 kg)  04/12/17 209 lb (94.8 kg)  Psych/Mental status: Alert, oriented x 3 (person, place, & time)       Eyes: PERLA Respiratory: No evidence of acute respiratory distress  Cervical Spine Area Exam  Skin & Axial Inspection: No masses, redness, edema, swelling, or associated skin lesions Alignment: Symmetrical Functional ROM: Unrestricted ROM      Stability: No instability detected Muscle Tone/Strength: Functionally intact. No obvious neuro-muscular anomalies detected. Sensory (Neurological): Unimpaired Palpation: No palpable anomalies              Upper Extremity (UE) Exam    Side: Right upper extremity  Side: Left upper extremity  Skin & Extremity Inspection: Skin color, temperature, and hair growth are WNL. No peripheral edema or cyanosis. No masses, redness, swelling, asymmetry, or associated skin lesions. No contractures.  Skin & Extremity Inspection: Skin color, temperature, and hair growth are WNL. No peripheral edema or cyanosis. No masses, redness, swelling, asymmetry, or associated skin lesions. No contractures.  Functional ROM: Unrestricted ROM          Functional ROM: Unrestricted ROM          Muscle Tone/Strength: Functionally intact. No obvious neuro-muscular anomalies detected.  Muscle Tone/Strength: Functionally intact. No obvious  neuro-muscular anomalies detected.  Sensory (Neurological): Unimpaired          Sensory (Neurological): Unimpaired          Palpation: No palpable anomalies              Palpation: No palpable anomalies              Specialized Test(s): Deferred         Specialized Test(s): Deferred          Thoracic Spine Area Exam  Skin & Axial Inspection: No masses, redness, or swelling Alignment: Symmetrical Functional ROM: Unrestricted ROM Stability: No instability detected Muscle Tone/Strength: Functionally intact. No obvious neuro-muscular anomalies detected. Sensory (Neurological): Unimpaired Muscle strength & Tone: No palpable anomalies  Lumbar Spine Area Exam  Skin & Axial Inspection: No masses, redness, or swelling Alignment: Symmetrical Functional ROM: Diminished ROM      Stability: No instability detected Muscle Tone/Strength: Functionally intact. No obvious neuro-muscular anomalies detected. Sensory (Neurological): Movement-associated discomfort Palpation: No palpable anomalies       Provocative Tests: Lumbar Hyperextension and rotation test: evaluation deferred today       Lumbar Lateral bending test: evaluation deferred today       Patrick's Maneuver: evaluation deferred today                    Gait & Posture Assessment  Ambulation: Unassisted Gait: Relatively normal for age and body habitus Posture: Antalgic   Lower Extremity Exam    Side: Right lower extremity  Side: Left lower extremity  Skin & Extremity Inspection: Skin color, temperature, and hair growth are WNL. No peripheral edema or cyanosis. No masses, redness, swelling, asymmetry, or associated skin lesions. No contractures.  Skin & Extremity Inspection: Skin color, temperature, and hair growth are WNL. No peripheral edema or cyanosis. No masses, redness, swelling, asymmetry, or associated skin lesions. No contractures.  Functional ROM: Unrestricted ROM          Functional ROM: Unrestricted ROM          Muscle  Tone/Strength: Functionally intact. No obvious neuro-muscular anomalies detected.  Muscle Tone/Strength: Functionally intact. No obvious neuro-muscular anomalies detected.  Sensory (Neurological): Unimpaired  Sensory (Neurological): Unimpaired  Palpation: No palpable anomalies  Palpation: No palpable anomalies   Assessment  Primary Diagnosis & Pertinent Problem List: The primary encounter diagnosis was Chronic lower extremity pain (Primary Area of Pain) (Bilateral) (R>L). 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  Status Diagnosis  Persistent Persistent Stable 1. Chronic lower extremity pain (Primary Area of Pain) (Bilateral) (R>L)   2. Chronic low back pain (Secondary Area of Pain) (Bilateral) (R>L)   3. L4-5 Lumbar foraminal disc protrusion (Right)   4. 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 Lumbar spondylosis with radiculitis (Bilateral) (L5)   6. Right S1 Hyperalgesia   7. Chronic L5 & S1 Lumbar radiculitis (Bilateral) (R>L)   8. DDD (degenerative disc disease), lumbar   9. Chronic foot pain (Bilateral) (R>L)   10. 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 Peripheral neurogenic pain   12. Neurogenic pain of  foot, unspecified laterality (Bilateral) Chronic    Problems updated and reviewed during this visit: Problem  Ddd (Degenerative Disc Disease), Lumbar  Peripheral Neurogenic Pain  Neurogenic pain of foot  (Bilateral)   Top and bottom of feet.    Time Note: Greater than 50% of the 40 minute(s) of face-to-face time spent with Ms. Defranco, was spent in counseling/coordination of care regarding: Ms. Lauter primary cause of pain, the treatment plan, treatment alternatives, the risks and possible complications of proposed treatment, the results, interpretation and significance of  her recent diagnostic interventional treatment(s), realistic expectations and the goals of pain management (increased in functionality).  Today I also spent quite a bit of time talking to the patient about spinal cord stimulator implants, trials, risks and possible complications, and requirements prior to trialing, such as medical psychology evaluation.  Plan of Care  Pharmacotherapy (Medications Ordered): Meds ordered this encounter  Medications  . gabapentin (NEURONTIN) 600 MG tablet    Sig: Take 1 tablet (600 mg total) by mouth every 8 (eight) hours.    Dispense:  90 tablet    Refill:  2    Do not place this medication, or any other prescription from our practice, on "Automatic Refill". Patient Rosander have prescription filled one day early if pharmacy is closed on scheduled refill date.   Medications administered today: Leahna Y. Burrill had no medications administered during this visit.   Procedure Orders     Lumbar Epidural Injection     Lumbar Transforaminal Epidural     [place] TRIAL Lab Orders  No laboratory test(s) ordered today   Imaging Orders  No imaging studies ordered today    Referral Orders     Ambulatory referral to Psychology  Interventional management options: Planned, scheduled, and/or pending:   Today we will start holding necessary process for a bilateral lumbar SCS implant trial.   Considering:   DiagnosticLidocaine infusionto evaluate the idiopathic small fiber peripheral neuropathy, and to see if she Zwiefelhofer be a good candidate for mexiletine. Diagnostic right-sided L4-5 transforaminal  LESI Diagnostic right-sided L4-5 interlaminar LESI  Diagnostic bilateral lumbar sympathetic blocks Possible bilateral lumbar facet RFA Diagnostic bilateral lumbar facet nerve block Possible bilateral lumbar facet RFA Diagnostic bilateral sacroiliac joint block Possible bilateral sacroiliac joint RFA Diagnostic bilateral intra-articular hip joint injection Diagnostic bilateral femoral nerve +obturatornerve block Possible bilateral femoral nerve + obturator nerve RFA Possible spinal cord stimulator trial   Palliative PRN treatment(s):   Palliative right L5-S1 interlaminar LESI    Provider-requested follow-up: Return for Procedure (w/ sedation): (B) L-SCS Trial.  No future appointments. Primary Care Physician: Maryland Pink, MD Location: [facility] Note by: Gaspar Cola, MD Date: 05/30/2017; Time: 5:53 PM

## 2017-05-30 NOTE — Progress Notes (Signed)
Safety precautions to be maintained throughout the outpatient stay will include: orient to surroundings, keep bed in low position, maintain call bell within reach at all times, provide assistance with transfer out of bed and ambulation.  

## 2017-07-06 ENCOUNTER — Encounter: Payer: Self-pay | Admitting: Pain Medicine

## 2017-07-12 ENCOUNTER — Ambulatory Visit: Payer: BLUE CROSS/BLUE SHIELD | Admitting: Pain Medicine

## 2017-07-19 ENCOUNTER — Ambulatory Visit: Payer: BLUE CROSS/BLUE SHIELD | Admitting: Pain Medicine

## 2017-08-30 ENCOUNTER — Other Ambulatory Visit: Payer: Self-pay | Admitting: Pain Medicine

## 2017-08-30 DIAGNOSIS — M79674 Pain in right toe(s): Principal | ICD-10-CM

## 2017-08-30 DIAGNOSIS — G609 Hereditary and idiopathic neuropathy, unspecified: Secondary | ICD-10-CM

## 2017-08-30 DIAGNOSIS — M792 Neuralgia and neuritis, unspecified: Secondary | ICD-10-CM

## 2017-08-30 DIAGNOSIS — M79675 Pain in left toe(s): Principal | ICD-10-CM

## 2017-08-30 DIAGNOSIS — G8929 Other chronic pain: Secondary | ICD-10-CM

## 2019-09-03 ENCOUNTER — Other Ambulatory Visit: Payer: Self-pay | Admitting: Neurology

## 2019-09-03 DIAGNOSIS — G43019 Migraine without aura, intractable, without status migrainosus: Secondary | ICD-10-CM

## 2019-09-17 ENCOUNTER — Ambulatory Visit: Payer: BC Managed Care – PPO

## 2019-09-22 ENCOUNTER — Ambulatory Visit: Payer: BC Managed Care – PPO | Admitting: Pain Medicine

## 2020-01-22 ENCOUNTER — Encounter (INDEPENDENT_AMBULATORY_CARE_PROVIDER_SITE_OTHER): Payer: Self-pay | Admitting: Vascular Surgery

## 2020-01-22 ENCOUNTER — Other Ambulatory Visit: Payer: Self-pay

## 2020-01-22 ENCOUNTER — Ambulatory Visit (INDEPENDENT_AMBULATORY_CARE_PROVIDER_SITE_OTHER): Payer: BC Managed Care – PPO | Admitting: Vascular Surgery

## 2020-01-22 VITALS — BP 149/99 | HR 83 | Ht 62.0 in | Wt 192.0 lb

## 2020-01-22 DIAGNOSIS — I10 Essential (primary) hypertension: Secondary | ICD-10-CM

## 2020-01-22 DIAGNOSIS — M5136 Other intervertebral disc degeneration, lumbar region: Secondary | ICD-10-CM

## 2020-01-22 DIAGNOSIS — M7989 Other specified soft tissue disorders: Secondary | ICD-10-CM

## 2020-01-22 DIAGNOSIS — M79669 Pain in unspecified lower leg: Secondary | ICD-10-CM

## 2020-01-25 ENCOUNTER — Encounter (INDEPENDENT_AMBULATORY_CARE_PROVIDER_SITE_OTHER): Payer: Self-pay | Admitting: Vascular Surgery

## 2020-01-25 DIAGNOSIS — M79669 Pain in unspecified lower leg: Secondary | ICD-10-CM | POA: Insufficient documentation

## 2020-01-25 NOTE — Progress Notes (Signed)
MRN : [id number]  Chelsea [name] is a 59 y.o. (October 18, 1960) female who presents with chief complaint of  Chief Complaint  Patient presents with  . New Patient (Initial Visit)    Hedrick.edema  .  History of Present Illness:   Patient is seen for evaluation of leg swelling. The patient first noticed the swelling remotely but is now concerned because of a significant increase in the overall edema. The swelling is associated with pain and discoloration. The patient notes that in the morning the legs are significantly improved but they steadily worsened throughout the course of the day. Elevation makes the legs better, dependency makes them much worse.   There is no history of ulcerations associated with the swelling.   The patient denies any recent changes in their medications.  The patient has not been wearing graduated compression.  The patient has no had any past angiography, interventions or vascular surgery.  The patient denies a history of DVT or PE. There is no prior history of phlebitis. There is no history of primary lymphedema.  There is no history of radiation treatment to the groin or pelvis No history of malignancies. No history of trauma or groin or pelvic surgery. No history of foreign travel or parasitic infections area    Current Meds  Medication Sig  . acetaminophen (TYLENOL) 500 MG tablet Take by mouth.  Marland Kitchen albuterol (VENTOLIN HFA) 108 (90 Base) MCG/ACT inhaler Inhale into the lungs.  Marland Kitchen amitriptyline (ELAVIL) 10 MG tablet TAKE 1 TO 2 TABLETS BY MOUTH AT BEDTIME  . busPIRone (BUSPAR) 7.5 MG tablet Take by mouth.  . furosemide (LASIX) 20 MG tablet Take by mouth.  Marland Kitchen glipiZIDE (GLUCOTROL XL) 2.5 MG 24 hr tablet Take by mouth.  . levothyroxine (SYNTHROID) 25 MCG tablet Take by mouth.  Marland Kitchen LORazepam (ATIVAN) 0.5 MG tablet Take by mouth.  . losartan (COZAAR) 100 MG tablet Take 100 mg daily by mouth.  . Omega-3 Fatty Acids (FISH OIL PO) Take by mouth.  Marland Kitchen PARoxetine  (PAXIL) 40 MG tablet Take by mouth.  . topiramate (TOPAMAX) 50 MG tablet Take by mouth.  . traMADol (ULTRAM) 50 MG tablet Take by mouth.    Past Medical History:  Diagnosis Date  . Allergy   . Anxiety   . Depression   . Hyperlipidemia   . Hypertension   . Sleep apnea   . Vitamin B12 deficiency   . Vitamin D deficiency     Past Surgical History:  Procedure Laterality Date  . ABDOMINAL HYSTERECTOMY    . nose    . TUBAL LIGATION      Social History Social History   Tobacco Use  . Smoking status: Current Every Day Smoker    Packs/day: 1.00    Years: 39.00    Pack years: 39.00    Types: Cigarettes  . Smokeless tobacco: Never Used  Vaping Use  . Vaping Use: Never used  Substance Use Topics  . Alcohol use: Yes    Comment: 1 once a month  . Drug use: No    Family History Family History  Problem Relation Age of Onset  . Cancer Father   No family history of bleeding/clotting disorders, porphyria or autoimmune disease   Allergies  Allergen Reactions  . Codeine     Hallicutations      REVIEW OF SYSTEMS (Negative unless checked)  Constitutional: [] Weight loss  [] Fever  [] Chills Cardiac: [] Chest pain   [] Chest pressure   [] Palpitations   [] Shortness  of breath when laying flat   [] Shortness of breath with exertion. Vascular:  [x] Pain in legs with walking   [x] Pain in legs at rest  [] History of DVT   [] Phlebitis   [x] Swelling in legs   [] Varicose veins   [] Non-healing ulcers Pulmonary:   [] Uses home oxygen   [] Productive cough   [] Hemoptysis   [] Wheeze  [] COPD   [] Asthma Neurologic:  [] Dizziness   [] Seizures   [] History of stroke   [] History of TIA  [] Aphasia   [] Vissual changes   [] Weakness or numbness in arm   [] Weakness or numbness in leg Musculoskeletal:   [] Joint swelling   [x] Joint pain   [x] Low back pain Hematologic:  [] Easy bruising  [] Easy bleeding   [] Hypercoagulable state   [] Anemic Gastrointestinal:  [] Diarrhea   [] Vomiting  [] Gastroesophageal  reflux/heartburn   [] Difficulty swallowing. Genitourinary:  [] Chronic kidney disease   [] Difficult urination  [] Frequent urination   [] Blood in urine Skin:  [] Rashes   [] Ulcers  Psychological:  [] History of anxiety   []  History of major depression.  Physical Examination  Vitals:   01/22/20 0828  BP: (!) 149/99  Pulse: 83  Weight: 192 lb (87.1 kg)  Height: 5\' 2"  (1.575 m)   Body mass index is 35.12 kg/m. Gen: WD/WN, NAD Head: [place]/AT, No temporalis wasting.  Ear/Nose/Throat: Hearing grossly intact, nares w/o erythema or drainage, poor dentition Eyes: PER, EOMI, sclera nonicteric.  Neck: Supple, no masses.  No bruit or JVD.  Pulmonary:  Good air movement, clear to auscultation bilaterally, no use of accessory muscles.  Cardiac: RRR, normal S1, S2, no Murmurs. Vascular: scattered varicosities present bilaterally.  Mild venous stasis changes to the legs bilaterally.  2+ soft pitting edema Vessel Right Left  Radial Palpable Palpable  PT Palpable Palpable  DP Palpable Palpable  Gastrointestinal: soft, non-distended. No guarding/no peritoneal signs.  Musculoskeletal: M/S 5/5 throughout.  No deformity or atrophy.  Neurologic: CN 2-12 intact. Pain and light touch intact in extremities.  Symmetrical.  Speech is fluent. Motor exam as listed above. Psychiatric: Judgment intact, Mood & affect appropriate for pt's clinical situation. Dermatologic: Mild venous rashes no ulcers noted.  No changes consistent with cellulitis.   CBC Lab Results  Component Value Date   WBC 11.7 (H) 04/30/2011   HGB 14.3 04/30/2011   HCT 40.7 04/30/2011   MCV 95 04/30/2011   PLT 381 04/30/2011    BMET    Component Value Date/Time   NA 135 02/06/2017 0941   K 4.2 02/06/2017 0941   CL 95 (L) 02/06/2017 0941   GLUCOSE 103 (H) 02/06/2017 0941   BUN 15 02/06/2017 0941   CREATININE 0.92 02/06/2017 0941   CALCIUM 9.7 02/06/2017 0941   GFRNONAA 71 02/06/2017 0941   GFRAA 82 02/06/2017 0941   CrCl cannot be  calculated (Patient's most recent lab result is older than the maximum 21 days allowed.).  COAG Lab Results  Component Value Date   INR 0.9 04/30/2011    Radiology No results found.   Assessment/Plan 1. Pain and swelling of lower leg, unspecified laterality Recommend:  I do not find evidence of Vascular pathology that would explain the patient's symptoms  The patient has atypical pain symptoms for vascular disease  I do not find evidence of Vascular pathology that would explain the patient's symptoms and I suspect the patient is c/o pseudoclaudication.  Patient should have an evaluation of his LS spine which I defer to her primary service.  The patient should continue walking and  begin a more formal exercise program. The patient should continue his antiplatelet therapy and aggressive treatment of the lipid abnormalities.  The patient should begin wearing graduated compression socks 15-20 mmHg strength to control her mild edema.  Patient will follow-up with me on a PRN basis  Further work-up of her lower extremity pain is deferred to the primary service     2. DDD (degenerative disc disease), lumbar See #1  3. Benign essential hypertension Continue antihypertensive medications as already ordered, these medications have been reviewed and there are no changes at this time.     Levora Dredge, MD  01/25/2020 9:28 AM

## 2020-05-28 ENCOUNTER — Other Ambulatory Visit: Payer: Self-pay | Admitting: Family Medicine

## 2020-05-28 DIAGNOSIS — Z1231 Encounter for screening mammogram for malignant neoplasm of breast: Secondary | ICD-10-CM

## 2020-06-22 ENCOUNTER — Other Ambulatory Visit: Payer: Self-pay

## 2020-06-22 ENCOUNTER — Ambulatory Visit
Admission: RE | Admit: 2020-06-22 | Discharge: 2020-06-22 | Disposition: A | Discharge status: Home / Self Care | Payer: BLUE CROSS/BLUE SHIELD | Source: Ambulatory Visit | Attending: Family Medicine | Admitting: Family Medicine

## 2020-06-22 DIAGNOSIS — Z1231 Encounter for screening mammogram for malignant neoplasm of breast: Secondary | ICD-10-CM | POA: Diagnosis present

## 2020-12-15 ENCOUNTER — Inpatient Hospital Stay: Payer: BLUE CROSS/BLUE SHIELD

## 2020-12-15 ENCOUNTER — Encounter: Payer: Self-pay | Admitting: Oncology

## 2020-12-15 ENCOUNTER — Inpatient Hospital Stay: Payer: BLUE CROSS/BLUE SHIELD | Attending: Oncology | Admitting: Oncology

## 2020-12-15 VITALS — BP 126/77 | HR 73 | Temp 97.7°F | Resp 18 | Wt 196.9 lb

## 2020-12-15 DIAGNOSIS — Z8041 Family history of malignant neoplasm of ovary: Secondary | ICD-10-CM | POA: Insufficient documentation

## 2020-12-15 DIAGNOSIS — Z809 Family history of malignant neoplasm, unspecified: Secondary | ICD-10-CM

## 2020-12-15 DIAGNOSIS — Z90710 Acquired absence of both cervix and uterus: Secondary | ICD-10-CM | POA: Insufficient documentation

## 2020-12-15 DIAGNOSIS — D751 Secondary polycythemia: Secondary | ICD-10-CM | POA: Diagnosis not present

## 2020-12-15 DIAGNOSIS — I10 Essential (primary) hypertension: Secondary | ICD-10-CM | POA: Insufficient documentation

## 2020-12-15 DIAGNOSIS — E119 Type 2 diabetes mellitus without complications: Secondary | ICD-10-CM | POA: Insufficient documentation

## 2020-12-15 DIAGNOSIS — Z801 Family history of malignant neoplasm of trachea, bronchus and lung: Secondary | ICD-10-CM | POA: Diagnosis not present

## 2020-12-15 DIAGNOSIS — D72820 Lymphocytosis (symptomatic): Secondary | ICD-10-CM

## 2020-12-15 DIAGNOSIS — Z808 Family history of malignant neoplasm of other organs or systems: Secondary | ICD-10-CM | POA: Insufficient documentation

## 2020-12-15 DIAGNOSIS — F17210 Nicotine dependence, cigarettes, uncomplicated: Secondary | ICD-10-CM | POA: Diagnosis not present

## 2020-12-15 LAB — CBC WITH DIFFERENTIAL/PLATELET
Abs Immature Granulocytes: 0.02 10*3/uL (ref 0.00–0.07)
Basophils Absolute: 0.1 10*3/uL (ref 0.0–0.1)
Basophils Relative: 1 %
Eosinophils Absolute: 0.2 10*3/uL (ref 0.0–0.5)
Eosinophils Relative: 2 %
HCT: 44.3 % (ref 36.0–46.0)
Hemoglobin: 15.6 g/dL — ABNORMAL HIGH (ref 12.0–15.0)
Immature Granulocytes: 0 %
Lymphocytes Relative: 51 %
Lymphs Abs: 4.7 10*3/uL — ABNORMAL HIGH (ref 0.7–4.0)
MCH: 32.2 pg (ref 26.0–34.0)
MCHC: 35.2 g/dL (ref 30.0–36.0)
MCV: 91.5 fL (ref 80.0–100.0)
Monocytes Absolute: 0.5 10*3/uL (ref 0.1–1.0)
Monocytes Relative: 6 %
Neutro Abs: 3.6 10*3/uL (ref 1.7–7.7)
Neutrophils Relative %: 40 %
Platelets: 258 10*3/uL (ref 150–400)
RBC: 4.84 MIL/uL (ref 3.87–5.11)
RDW: 12.7 % (ref 11.5–15.5)
WBC: 9.1 10*3/uL (ref 4.0–10.5)
nRBC: 0 % (ref 0.0–0.2)

## 2020-12-15 LAB — COMPREHENSIVE METABOLIC PANEL
ALT: 24 U/L (ref 0–44)
AST: 19 U/L (ref 15–41)
Albumin: 4 g/dL (ref 3.5–5.0)
Alkaline Phosphatase: 62 U/L (ref 38–126)
Anion gap: 6 (ref 5–15)
BUN: 16 mg/dL (ref 6–20)
CO2: 25 mmol/L (ref 22–32)
Calcium: 9.1 mg/dL (ref 8.9–10.3)
Chloride: 107 mmol/L (ref 98–111)
Creatinine, Ser: 0.77 mg/dL (ref 0.44–1.00)
GFR, Estimated: >60 mL/min (ref >60)
Glucose, Bld: 90 mg/dL (ref 70–99)
Potassium: 3.8 mmol/L (ref 3.5–5.1)
Sodium: 138 mmol/L (ref 135–145)
Total Bilirubin: 0.8 mg/dL (ref 0.3–1.2)
Total Protein: 7.5 g/dL (ref 6.5–8.1)

## 2020-12-15 LAB — LACTATE DEHYDROGENASE: LDH: 105 U/L (ref 98–192)

## 2020-12-15 NOTE — Progress Notes (Signed)
Pt here to establish care.

## 2020-12-15 NOTE — Progress Notes (Signed)
Hematology/Oncology Consult note [facility] [phone] [phone]   Patient Care Team: Jerl Mina, MD as PCP - General (Family Medicine)  REFERRING PROVIDER: Jerl Mina, MD  CHIEF COMPLAINTS/REASON FOR VISIT:  Evaluation of leukocytosis  HISTORY OF PRESENTING ILLNESS:  Chelsea [name] is a  60 y.o.  female with PMH listed below who was referred to me for evaluation of leukocytosis Reviewed patient' recent labs obtained by PCP.   CBC showed elevated white count of 9.7, Hb 16, lymphocytosis, ALC 4.7.  Previous lab records reviewed. Lymphocytosis sonset of chronic, duration is since at 2021  No aggravating or elevated factors. Associated symptoms or signs:  Denies weight loss, fever, chills,night sweats, or fatigue Smoking history: Current everyday smoker.  Half pack a day.  She used to smoke a pack a day. History of recent oral steroid use or steroid injection: Denies History of recent infection: Denies  Patient reports strong family history of cancer.  Her brother had leukemia and passed away 4 years ago.  Review of Systems  Constitutional:  Negative for appetite change, chills, fatigue and fever.  HENT:   Negative for hearing loss and voice change.   Eyes:  Negative for eye problems.  Respiratory:  Negative for chest tightness and cough.   Cardiovascular:  Negative for chest pain.  Gastrointestinal:  Negative for abdominal distention, abdominal pain and blood in stool.  Endocrine: Negative for hot flashes.  Genitourinary:  Negative for difficulty urinating and frequency.   Musculoskeletal:  Negative for arthralgias.  Skin:  Negative for itching and rash.  Neurological:  Negative for extremity weakness.  Hematological:  Negative for adenopathy.  Psychiatric/Behavioral:  Negative for confusion. The patient is nervous/anxious.     MEDICAL HISTORY:  Past Medical History:  Diagnosis Date   Allergy    Anxiety    Depression     Diabetes mellitus without complication (HCC)    Hyperlipidemia    Hypertension    Panic disorder    Peripheral neuropathy    Sleep apnea    Vitamin B12 deficiency    Vitamin D deficiency     SURGICAL HISTORY: Past Surgical History:  Procedure Laterality Date   ABDOMINAL HYSTERECTOMY     nose     TUBAL LIGATION      SOCIAL HISTORY: Social History   Socioeconomic History   Marital status: Married    Spouse name: Not on file   Number of children: Not on file   Years of education: Not on file   Highest education level: Not on file  Occupational History   Not on file  Tobacco Use   Smoking status: Every Day    Packs/day: 0.50    Years: 44.00    Pack years: 22.00    Types: Cigarettes   Smokeless tobacco: Never  Vaping Use   Vaping Use: Never used  Substance and Sexual Activity   Alcohol use: Yes    Comment: 1 once a month /very rare   Drug use: No   Sexual activity: Not on file  Other Topics Concern   Not on file  Social History Narrative   Not on file   Social Determinants of Health   Financial Resource Strain: Not on file  Food Insecurity: Not on file  Transportation Needs: Not on file  Physical Activity: Not on file  Stress: Not on file  Social Connections: Not on file  Intimate Partner Violence: Not on file    FAMILY HISTORY: Family History  Problem  Relation Age of Onset   Diabetes Mother    Stroke Mother    Aneurysm Mother    Lung cancer Father    Emphysema Father    Stroke Sister    Bone cancer Sister    Diabetes Brother    Leukemia Brother    Diabetes Brother    Ovarian cancer Maternal Aunt    Stroke Maternal Uncle    Lung cancer Paternal Uncle    Brain cancer Paternal Uncle    Liver cancer Paternal Uncle    Lung cancer Maternal Grandfather    Lung cancer Paternal Grandmother    Heart attack Paternal Grandfather     ALLERGIES:  is allergic to codeine.  MEDICATIONS:  Current Outpatient Medications  Medication Sig Dispense  Refill   acetaminophen (TYLENOL) 500 MG tablet Take by mouth.     albuterol (VENTOLIN HFA) 108 (90 Base) MCG/ACT inhaler Inhale into the lungs.     amitriptyline (ELAVIL) 10 MG tablet TAKE 1 TO 2 TABLETS BY MOUTH AT BEDTIME     busPIRone (BUSPAR) 7.5 MG tablet Take by mouth.     cyanocobalamin 1000 MCG tablet Take 1,000 mcg by mouth daily.     furosemide (LASIX) 20 MG tablet Take by mouth.     gabapentin (NEURONTIN) 600 MG tablet Take 1 tablet (600 mg total) by mouth every 8 (eight) hours. 90 tablet 2   glipiZIDE (GLUCOTROL XL) 2.5 MG 24 hr tablet Take by mouth.     levothyroxine (SYNTHROID) 25 MCG tablet Take by mouth.     LORazepam (ATIVAN) 0.5 MG tablet Take by mouth.     losartan (COZAAR) 100 MG tablet Take 100 mg daily by mouth.  10   nicotine (NICODERM CQ - DOSED IN MG/24 HOURS) 21 mg/24hr patch Place 21 mg onto the skin daily.     Omega-3 Fatty Acids (FISH OIL PO) Take by mouth.     PARoxetine (PAXIL) 40 MG tablet Take by mouth.     Probiotic Product (PROBIOTIC DAILY PO) Take 1 capsule by mouth daily.     pyridoxine (B-6) 100 MG tablet Take 100 mg by mouth daily.     topiramate (TOPAMAX) 50 MG tablet Take by mouth.     traMADol (ULTRAM) 50 MG tablet Take by mouth.     Vitamin D3 (VITAMIN D) 25 MCG tablet Take 1,000 Units by mouth daily.     carvedilol (COREG) 6.25 MG tablet Take 6.25 mg by mouth 2 (two) times daily.     rosuvastatin (CRESTOR) 10 MG tablet Take 10 mg by mouth daily.     varenicline (CHANTIX) 1 MG tablet Take 1 mg by mouth 2 (two) times daily.     No current facility-administered medications for this visit.     PHYSICAL EXAMINATION: ECOG PERFORMANCE STATUS: 0 - Asymptomatic Vitals:   12/15/20 0959  BP: 126/77  Pulse: 73  Resp: 18  Temp: 97.7 F (36.5 C)   Filed Weights   12/15/20 0959  Weight: 196 lb 14.4 oz (89.3 kg)    Physical Exam Constitutional:      General: She is not in acute distress. HENT:     Head: Normocephalic and atraumatic.  Eyes:      General: No scleral icterus. Cardiovascular:     Rate and Rhythm: Normal rate and regular rhythm.     Heart sounds: Normal heart sounds.  Pulmonary:     Effort: Pulmonary effort is normal. No respiratory distress.     Breath sounds: Normal breath  sounds. No wheezing.  Abdominal:     General: Bowel sounds are normal. There is no distension.     Palpations: Abdomen is soft.  Musculoskeletal:        General: No deformity. Normal range of motion.     Cervical back: Normal range of motion and neck supple.  Skin:    General: Skin is warm and dry.     Findings: No erythema or rash.  Neurological:     Mental Status: She is alert and oriented to person, place, and time. Mental status is at baseline.     Cranial Nerves: No cranial nerve deficit.     Coordination: Coordination normal.  Psychiatric:     Comments: Anxious    CMP Latest Ref Rng & Units 12/15/2020  Glucose 70 - 99 mg/dL 90  BUN 6 - 20 mg/dL 16  Creatinine 1.61 - 0.96 mg/dL 0.45  Sodium 409 - 811 mmol/L 138  Potassium 3.5 - 5.1 mmol/L 3.8  Chloride 98 - 111 mmol/L 107  CO2 22 - 32 mmol/L 25  Calcium 8.9 - 10.3 mg/dL 9.1  Total Protein 6.5 - 8.1 g/dL 7.5  Total Bilirubin 0.3 - 1.2 mg/dL 0.8  Alkaline Phos 38 - 126 U/L 62  AST 15 - 41 U/L 19  ALT 0 - 44 U/L 24   CBC Latest Ref Rng & Units 12/15/2020  WBC 4.0 - 10.5 K/uL 9.1  Hemoglobin 12.0 - 15.0 g/dL 15.6(H)  Hematocrit 36.0 - 46.0 % 44.3  Platelets 150 - 400 K/uL 258     RADIOGRAPHIC STUDIES: I have personally reviewed the radiological images as listed and agreed with the findings in the report. No results found.  LABORATORY DATA:  I have reviewed the data as listed Lab Results  Component Value Date   WBC 9.1 12/15/2020   HGB 15.6 (H) 12/15/2020   HCT 44.3 12/15/2020   MCV 91.5 12/15/2020   PLT 258 12/15/2020   Recent Labs    12/15/20 1045  NA 138  K 3.8  CL 107  CO2 25  GLUCOSE 90  BUN 16  CREATININE 0.77  CALCIUM 9.1  GFRNONAA >60  PROT  7.5  ALBUMIN 4.0  AST 19  ALT 24  ALKPHOS 62  BILITOT 0.8   Iron/TIBC/Ferritin/ %Sat No results found for: IRON, TIBC, FERRITIN, IRONPCTSAT      ASSESSMENT & PLAN:  1. Erythrocytosis   2. Lymphocytosis   3. Tobacco smoker, 1 pack of cigarettes or less per day   4. Family history of cancer    Labs reviewed and discussed w Leukocytosis, predominantly lymphocytosis, can be secondary to infection, chronic inflammation, smoking, autoimmune disease, or underlying bone marrow disorders.   Patient is currently everyday smoker which [place] contribute to leukocytosis.  Rule out other etiologies. For the work up of patient's leukocytosis, I recommend checking CBC;CMP, LDH, pathology smear review, flowcytometry, etc.   Erythrocytosis, probably secondary to smoking.  Rule out other etiologies.  Check JAK2 mutation with reflex, BCR ABL 1 FISH.  Erythropoietin, carbon monoxide level.  Current everyday smoker, discussed in details about smoke cessation.  Family history of cancer, refer to genetic testing.   Orders Placed This Encounter  Procedures   CBC with Differential/Platelet    Standing Status:   Future    Number of Occurrences:   1    Standing Expiration Date:   12/15/2021   Comprehensive metabolic panel    Standing Status:   Future    Number of Occurrences:  1    Standing Expiration Date:   12/15/2021   JAK2 V617F, w Reflex to CALR/E12/MPL    Standing Status:   Future    Number of Occurrences:   1    Standing Expiration Date:   12/15/2021   BCR-ABL1 FISH    Standing Status:   Future    Number of Occurrences:   1    Standing Expiration Date:   12/15/2021   Carbon monoxide, blood (performed at ref lab)    Standing Status:   Future    Number of Occurrences:   1    Standing Expiration Date:   12/15/2021   Erythropoietin    Standing Status:   Future    Number of Occurrences:   1    Standing Expiration Date:   12/15/2021   Lactate dehydrogenase    Standing Status:   Future     Number of Occurrences:   1    Standing Expiration Date:   12/15/2021   Flow cytometry panel-leukemia/lymphoma work-up    Standing Status:   Future    Number of Occurrences:   1    Standing Expiration Date:   12/15/2021   Ambulatory referral to Genetics    Referral Priority:   Routine    Referral Type:   Consultation    Referral Reason:   Specialty Services Required    Referred to Provider:   Lacy Duverney T    Number of Visits Requested:   1   Ambulatory Referral for Lung Cancer Screening    Referral Priority:   Routine    Referral Type:   Consultation    Referral Reason:   Specialty Services Required    Number of Visits Requested:   1    All questions were answered. The patient knows to call the clinic with any problems questions or concerns.  Return of visit: 2 weeks to discuss labs. Thank you for this kind referral and the opportunity to participate in the care of this patient. A copy of today's note is routed to referring provider   Rickard Patience, MD, PhD Hematology Oncology [facility] at [facility] Pager- [phone] 12/15/2020

## 2020-12-16 LAB — ERYTHROPOIETIN: Erythropoietin: 6.8 m[IU]/mL (ref 2.6–18.5)

## 2020-12-17 LAB — COMP PANEL: LEUKEMIA/LYMPHOMA

## 2020-12-17 LAB — CARBON MONOXIDE, BLOOD (PERFORMED AT REF LAB): Carbon Monoxide, Blood: 7.5 % — ABNORMAL HIGH (ref 0.0–3.6)

## 2020-12-20 LAB — BCR-ABL1 FISH
Cells Analyzed: 200
Cells Counted: 200

## 2020-12-24 ENCOUNTER — Telehealth: Payer: Self-pay | Admitting: *Deleted

## 2020-12-24 DIAGNOSIS — F17210 Nicotine dependence, cigarettes, uncomplicated: Secondary | ICD-10-CM

## 2020-12-24 NOTE — Telephone Encounter (Signed)
Pt was referred to Lung cancer screening program, they should reach out to her with information about CT.

## 2020-12-24 NOTE — Telephone Encounter (Signed)
Spoke with patient Scheduled SD[facility] 01/03/21 9:00 CT order was placed Pt voiced understanding and had no further questions.

## 2020-12-24 NOTE — Addendum Note (Signed)
Addended by: Abigail Miyamoto D on: 12/24/2020 02:59 PM   Modules accepted: Orders

## 2020-12-24 NOTE — Telephone Encounter (Signed)
Patient called stating that she was told at her last appointment that Dr Cathie Hoops wanted her to have a CT scan prior to her next appointment, but she has not heard anything about an appointment for this. I do not see mention of it in office note or in the disposition and follow up note. Please advise

## 2020-12-27 LAB — CALR + JAK2 E12-15 + MPL (REFLEXED)

## 2020-12-27 LAB — JAK2 V617F, W REFLEX TO CALR/E12/MPL

## 2020-12-29 ENCOUNTER — Inpatient Hospital Stay: Payer: BLUE CROSS/BLUE SHIELD

## 2020-12-29 ENCOUNTER — Encounter: Payer: Self-pay | Admitting: Licensed Clinical Social Worker

## 2020-12-29 ENCOUNTER — Inpatient Hospital Stay (HOSPITAL_BASED_OUTPATIENT_CLINIC_OR_DEPARTMENT_OTHER): Payer: BLUE CROSS/BLUE SHIELD | Admitting: Oncology

## 2020-12-29 ENCOUNTER — Inpatient Hospital Stay (HOSPITAL_BASED_OUTPATIENT_CLINIC_OR_DEPARTMENT_OTHER): Payer: BLUE CROSS/BLUE SHIELD | Admitting: Licensed Clinical Social Worker

## 2020-12-29 ENCOUNTER — Encounter: Payer: Self-pay | Admitting: Oncology

## 2020-12-29 VITALS — BP 119/63 | HR 72 | Temp 98.7°F | Resp 18 | Wt 198.2 lb

## 2020-12-29 DIAGNOSIS — Z8041 Family history of malignant neoplasm of ovary: Secondary | ICD-10-CM

## 2020-12-29 DIAGNOSIS — Z808 Family history of malignant neoplasm of other organs or systems: Secondary | ICD-10-CM

## 2020-12-29 DIAGNOSIS — Z8051 Family history of malignant neoplasm of kidney: Secondary | ICD-10-CM

## 2020-12-29 DIAGNOSIS — D751 Secondary polycythemia: Secondary | ICD-10-CM

## 2020-12-29 DIAGNOSIS — Z809 Family history of malignant neoplasm, unspecified: Secondary | ICD-10-CM

## 2020-12-29 DIAGNOSIS — D72820 Lymphocytosis (symptomatic): Secondary | ICD-10-CM

## 2020-12-29 DIAGNOSIS — Z801 Family history of malignant neoplasm of trachea, bronchus and lung: Secondary | ICD-10-CM

## 2020-12-29 DIAGNOSIS — Z806 Family history of leukemia: Secondary | ICD-10-CM | POA: Diagnosis not present

## 2020-12-29 DIAGNOSIS — F17210 Nicotine dependence, cigarettes, uncomplicated: Secondary | ICD-10-CM | POA: Diagnosis not present

## 2020-12-29 DIAGNOSIS — Z800 Family history of malignant neoplasm of digestive organs: Secondary | ICD-10-CM

## 2020-12-29 NOTE — Progress Notes (Signed)
REFERRING PROVIDER: Earlie Server, MD [place],  [place] [address]  PRIMARY PROVIDER:  [place] Pink, MD  PRIMARY REASON FOR VISIT:  1. Family history of ovarian cancer   2. Family history of leukemia   3. Family history of stomach cancer   4. Family history of kidney cancer   5. Family history of bone cancer   6. Family history of lung cancer      HISTORY OF PRESENT ILLNESS:   Chelsea [name], a 60 y.o. female, was seen for a  cancer genetics consultation at the request of Dr. Tasia Catchings due to a family history of cancer. Chelsea [name] presents to clinic today to discuss the possibility of a hereditary predisposition to cancer, genetic testing, and to further clarify her future cancer risks, as well as potential cancer risks for family members.   Chelsea [name] is a 60 y.o. female with no personal history of cancer.    CANCER HISTORY:  Oncology History   No history exists.     RISK FACTORS:  Menarche was at age 19.  First live birth at age 65.  OCP use for approximately 2 years.  Ovaries intact: yes.  Hysterectomy: yes.  Menopausal status: postmenopausal.  HRT use: 0 years. Colonoscopy: no; not examined. Mammogram within the last year: yes. Number of breast biopsies: 0.  Past Medical History:  Diagnosis Date   Allergy    Anxiety    Depression    Diabetes mellitus without complication ([facility])    Family history of bone cancer    Family history of kidney cancer    Family history of leukemia    Family history of lung cancer    Family history of ovarian cancer    Family history of stomach cancer    Hyperlipidemia    Hypertension    Panic disorder    Peripheral neuropathy    Sleep apnea    Vitamin B12 deficiency    Vitamin D deficiency     Past Surgical History:  Procedure Laterality Date   ABDOMINAL HYSTERECTOMY     nose     TUBAL LIGATION      Social History   Socioeconomic History   Marital status: Married    Spouse name: Not on file   Number of children:  Not on file   Years of education: Not on file   Highest education level: Not on file  Occupational History   Not on file  Tobacco Use   Smoking status: Every Day    Packs/day: 0.50    Years: 44.00    Pack years: 22.00    Types: Cigarettes   Smokeless tobacco: Never  Vaping Use   Vaping Use: Never used  Substance and Sexual Activity   Alcohol use: Yes    Comment: 1 once a month /very rare   Drug use: No   Sexual activity: Not on file  Other Topics Concern   Not on file  Social History Narrative   Not on file   Social Determinants of Health   Financial Resource Strain: Not on file  Food Insecurity: Not on file  Transportation Needs: Not on file  Physical Activity: Not on file  Stress: Not on file  Social Connections: Not on file     FAMILY HISTORY:  We obtained a detailed, 4-generation family history.  Significant diagnoses are listed below: Family History  Problem Relation Age of Onset   Diabetes Mother    Stroke Mother    Aneurysm Mother  Lung cancer Father    Emphysema Father    Stroke Sister    Bone cancer Sister    Diabetes Brother    Leukemia Brother    Diabetes Brother    Ovarian cancer Maternal Aunt    Stroke Maternal Uncle    Lung cancer Paternal Uncle    Brain cancer Paternal Uncle    Liver cancer Paternal Uncle    Lung cancer Maternal Grandfather    Lung cancer Paternal Grandmother    Heart attack Paternal Grandfather    Chelsea [name] has 2 sons (67 and 36). She has 2 brothers and 2 sisters. One brother had leukemia and died at 54. A sister had osteosarcoma of her clavicle in her 29s and is living at 38.   Chelsea [name] mother died at 65. Patient had 1 maternal aunt, 1 maternal uncle. Her aunt had ovarian cancer and died in her 30s. No known cousins with cancer. Maternal grandmother died in her 67s, grandfather died of lung cancer over the age of 62 and had history of smoking.  Chelsea [name] father died at 43 of lung cancer, he had history of smoking. Patient  had 4 paternal uncles. 2 had lung cancer. One had liver cancer and died in his late 96s. A paternal cousin had stomach cancer and died of it in her 51s, and her brother had kidney cancer recently at 7. Paternal grandmother had lung cancer as well at 53 and passed of it. Paternal grandfather died at 59 of heart issues.   Chelsea [name] is unaware of previous family history of genetic testing for hereditary cancer risks. Patient's maternal ancestors are of English descent, and paternal ancestors are of unknown descent. There is no reported Ashkenazi Jewish ancestry. There is no known consanguinity.    GENETIC COUNSELING ASSESSMENT: Chelsea [name] is a 60 y.o. female with a family history of cancer which is somewhat suggestive of a hereditary cancer syndrome and predisposition to cancer. We, therefore, discussed and recommended the following at today's visit.   DISCUSSION: We discussed that approximately 5-10% of cancer is hereditary. Most cases of hereditary ovarian cancer are associated with BRCA1/BRCA2 genes, although there are other genes associated with hereditary cancer as well. There are some genes associated with other cancers we see in her family such as stomach, osteosarcoma, and leukemia. Lung cancer is typically not hereditary but some cases are associated with EGFR. Cancers and risks are gene specific.  We discussed that testing is beneficial for several reasons including knowing about other cancer risks, identifying potential screening and risk-reduction options that [name] be appropriate, and to understand if other family members could be at risk for cancer and allow them to undergo genetic testing.   We reviewed the characteristics, features and inheritance patterns of hereditary cancer syndromes. We also discussed genetic testing, including the appropriate family members to test, the process of testing, insurance coverage and turn-around-time for results. We discussed the implications of a negative, positive  and/or variant of uncertain significant result. We recommended Chelsea [name] pursue genetic testing for the CustomNext-RNA gene panel.   The CustomNext-Cancer + RNAinsight panel  includes sequencing and/or deletion duplication testing of the following 91 genes: AIP, ALK, APC*, ATM*, AXIN2, BAP1, BARD1, BLM, BMPR1A, BRCA1*, BRCA2*, BRIP1*, CDC73, CDH1*, CDK4, CDKN1B, CDKN2A, CHEK2*, CTNNA1, DICER1, FANCC, FH, FLCN, GALNT12, KIF1B, LZTR1, MAX, MEN1, MET, MLH1*, MRE11A, MSH2*, MSH3, MSH6*, MUTYH*, NBN, NF1*, NF2, NTHL1, PALB2*, PHOX2B, PMS2*, POT1, PRKAR1A, PTCH1, PTEN*, RAD50, RAD51C*, RAD51D*, RB1, RECQL, RET, SDHA, SDHAF2, SDHB,  SDHC, SDHD, SMAD4, SMARCA4, SMARCB1, SMARCE1, STK11, SUFU, TMEM127, TP53*, TSC1, TSC2, VHL and XRCC2 (sequencing and deletion/duplication); CASR, CFTR, CPA1, CTRC, EGFR, EGLN1, FAM175A, HOXB13, KIT, MITF, MLH3, PALLD, PDGFRA, POLD1, POLE, PRSS1, RINT1, RPS20, SPINK1 and TERT (sequencing only); EPCAM and GREM1 (deletion/duplication only).   Based on Ms. 9 family history of cancer, she meets medical criteria for genetic testing. Despite that she meets criteria, she Crystal still have an out of pocket cost. We discussed that if her out of pocket cost for testing is over $100, the laboratory will call and confirm whether she wants to proceed with testing.  If the out of pocket cost of testing is less than $100 she will be billed by the genetic testing laboratory.   PLAN: After considering the risks, benefits, and limitations, Chelsea [name] provided informed consent to pursue genetic testing and the blood sample was sent to [facility] for analysis of the CustomNext+RNA panel. Results should be available within approximately 2-3 weeks' time, at which point they will be disclosed by telephone to Chelsea [name], as will any additional recommendations warranted by these results. Chelsea [name] will receive a summary of her genetic counseling visit and a copy of her results once available. This information will  also be available in Epic.   Chelsea [name] questions were answered to her satisfaction today. Our contact information was provided should additional questions or concerns arise. Thank you for the referral and allowing [place] to share in the care of your patient.   Faith Rogue, MS, [facility] Genetic Counselor [email or web address] Phone: [phone]  The patient was seen for a total of 30 minutes in face-to-face genetic counseling.  Patient was seen with her husband, Chelsea [name].  Dr. Grayland Ormond was available for discussion regarding this case.   _______________________________________________________________________ For Office Staff:  Number of people involved in session: 2 Was an Intern/ student involved with case: no

## 2020-12-29 NOTE — Progress Notes (Signed)
Pt here for follow up. No new concerns voiced.   

## 2020-12-29 NOTE — Progress Notes (Signed)
Hematology/Oncology progress note [facility] [phone] [phone]   Patient Care Team: Jerl Mina, MD as PCP - General (Family Medicine)  REFERRING PROVIDER: Jerl Mina, MD  CHIEF COMPLAINTS/REASON FOR VISIT:   leukocytosis and erythrocytosis.  HISTORY OF PRESENTING ILLNESS:  Chelsea [name] is a  60 y.o.  female with PMH listed below who was referred to me for evaluation of leukocytosis Reviewed patient' recent labs obtained by PCP.   CBC showed elevated white count of 9.7, Hb 16, lymphocytosis, ALC 4.7.  Previous lab records reviewed. Lymphocytosis sonset of chronic, duration is since at 2021  No aggravating or elevated factors. Associated symptoms or signs:  Denies weight loss, fever, chills,night sweats, or fatigue Smoking history: Current everyday smoker.  Half pack a day.  She used to smoke a pack a day. History of recent oral steroid use or steroid injection: Denies History of recent infection: Denies  Patient reports strong family history of cancer.  Her brother had leukemia and passed away 4 years ago.  INTERVAL HISTORY Chelsea [name] Yett is a 60 y.o. female who has above history reviewed by me today presents for follow up visit for leukocytosis and erythrocytosis. She had blood work done and presents to discuss results.    Review of Systems  Constitutional:  Negative for appetite change, chills, fatigue and fever.  HENT:   Negative for hearing loss and voice change.   Eyes:  Negative for eye problems.  Respiratory:  Negative for chest tightness and cough.   Cardiovascular:  Negative for chest pain.  Gastrointestinal:  Negative for abdominal distention, abdominal pain and blood in stool.  Endocrine: Negative for hot flashes.  Genitourinary:  Negative for difficulty urinating and frequency.   Musculoskeletal:  Negative for arthralgias.  Skin:  Negative for itching and rash.  Neurological:  Negative for extremity  weakness.  Hematological:  Negative for adenopathy.  Psychiatric/Behavioral:  Negative for confusion. The patient is nervous/anxious.     MEDICAL HISTORY:  Past Medical History:  Diagnosis Date   Allergy    Anxiety    Depression    Diabetes mellitus without complication ([facility])    Family history of bone cancer    Family history of kidney cancer    Family history of leukemia    Family history of lung cancer    Family history of ovarian cancer    Family history of stomach cancer    Hyperlipidemia    Hypertension    Panic disorder    Peripheral neuropathy    Sleep apnea    Vitamin B12 deficiency    Vitamin D deficiency     SURGICAL HISTORY: Past Surgical History:  Procedure Laterality Date   ABDOMINAL HYSTERECTOMY     nose     TUBAL LIGATION      SOCIAL HISTORY: Social History   Socioeconomic History   Marital status: Married    Spouse name: Not on file   Number of children: Not on file   Years of education: Not on file   Highest education level: Not on file  Occupational History   Not on file  Tobacco Use   Smoking status: Every Day    Packs/day: 0.50    Years: 44.00    Pack years: 22.00    Types: Cigarettes   Smokeless tobacco: Never  Vaping Use   Vaping Use: Never used  Substance and Sexual Activity   Alcohol use: Yes    Comment: 1 once a month /very rare  Drug use: No   Sexual activity: Not on file  Other Topics Concern   Not on file  Social History Narrative   Not on file   Social Determinants of Health   Financial Resource Strain: Not on file  Food Insecurity: Not on file  Transportation Needs: Not on file  Physical Activity: Not on file  Stress: Not on file  Social Connections: Not on file  Intimate Partner Violence: Not on file    FAMILY HISTORY: Family History  Problem Relation Age of Onset   Diabetes Mother    Stroke Mother    Aneurysm Mother    Lung cancer Father    Emphysema Father    Stroke Sister    Bone cancer Sister     Diabetes Brother    Leukemia Brother    Diabetes Brother    Ovarian cancer Maternal Aunt    Stroke Maternal Uncle    Lung cancer Paternal Uncle    Brain cancer Paternal Uncle    Liver cancer Paternal Uncle    Lung cancer Maternal Grandfather    Lung cancer Paternal Grandmother    Heart attack Paternal Grandfather     ALLERGIES:  is allergic to codeine.  MEDICATIONS:  Current Outpatient Medications  Medication Sig Dispense Refill   acetaminophen (TYLENOL) 500 MG tablet Take by mouth.     albuterol (VENTOLIN HFA) 108 (90 Base) MCG/ACT inhaler Inhale into the lungs.     amitriptyline (ELAVIL) 10 MG tablet TAKE 1 TO 2 TABLETS BY MOUTH AT BEDTIME     carvedilol (COREG) 6.25 MG tablet Take 6.25 mg by mouth 2 (two) times daily.     cyanocobalamin 1000 MCG tablet Take 1,000 mcg by mouth daily.     furosemide (LASIX) 20 MG tablet Take by mouth.     gabapentin (NEURONTIN) 600 MG tablet Take 1 tablet (600 mg total) by mouth every 8 (eight) hours. 90 tablet 2   glipiZIDE (GLUCOTROL XL) 2.5 MG 24 hr tablet Take by mouth.     levothyroxine (SYNTHROID) 25 MCG tablet Take by mouth.     LORazepam (ATIVAN) 0.5 MG tablet Take by mouth.     losartan (COZAAR) 100 MG tablet Take 100 mg daily by mouth.  10   nicotine (NICODERM CQ - DOSED IN MG/24 HOURS) 21 mg/24hr patch Place 21 mg onto the skin daily.     Omega-3 Fatty Acids (FISH OIL PO) Take by mouth.     PARoxetine (PAXIL) 40 MG tablet Take by mouth.     Probiotic Product (PROBIOTIC DAILY PO) Take 1 capsule by mouth daily.     pyridoxine (B-6) 100 MG tablet Take 100 mg by mouth daily.     rosuvastatin (CRESTOR) 10 MG tablet Take 10 mg by mouth daily.     topiramate (TOPAMAX) 50 MG tablet Take by mouth.     traMADol (ULTRAM) 50 MG tablet Take by mouth.     varenicline (CHANTIX) 1 MG tablet Take 1 mg by mouth 2 (two) times daily.     Vitamin D3 (VITAMIN D) 25 MCG tablet Take 1,000 Units by mouth daily.     No current facility-administered  medications for this visit.     PHYSICAL EXAMINATION: ECOG PERFORMANCE STATUS: 0 - Asymptomatic Vitals:   12/29/20 0953  BP: 119/63  Pulse: 72  Resp: 18  Temp: 98.7 F (37.1 C)   Filed Weights   12/29/20 0953  Weight: 198 lb 3.2 oz (89.9 kg)    Physical Exam Constitutional:  General: She is not in acute distress. HENT:     Head: Normocephalic and atraumatic.  Eyes:     General: No scleral icterus. Cardiovascular:     Rate and Rhythm: Normal rate and regular rhythm.     Heart sounds: Normal heart sounds.  Pulmonary:     Effort: Pulmonary effort is normal. No respiratory distress.     Breath sounds: Normal breath sounds. No wheezing.  Abdominal:     General: Bowel sounds are normal. There is no distension.     Palpations: Abdomen is soft.  Musculoskeletal:        General: No deformity. Normal range of motion.     Cervical back: Normal range of motion and neck supple.  Skin:    General: Skin is warm and dry.     Findings: No erythema or rash.  Neurological:     Mental Status: She is alert and oriented to person, place, and time. Mental status is at baseline.     Cranial Nerves: No cranial nerve deficit.     Coordination: Coordination normal.  Psychiatric:     Comments: Anxious    CMP Latest Ref Rng & Units 12/15/2020  Glucose 70 - 99 mg/dL 90  BUN 6 - 20 mg/dL 16  Creatinine 7.62 - 8.31 mg/dL 5.17  Sodium 616 - 073 mmol/L 138  Potassium 3.5 - 5.1 mmol/L 3.8  Chloride 98 - 111 mmol/L 107  CO2 22 - 32 mmol/L 25  Calcium 8.9 - 10.3 mg/dL 9.1  Total Protein 6.5 - 8.1 g/dL 7.5  Total Bilirubin 0.3 - 1.2 mg/dL 0.8  Alkaline Phos 38 - 126 U/L 62  AST 15 - 41 U/L 19  ALT 0 - 44 U/L 24   CBC Latest Ref Rng & Units 12/15/2020  WBC 4.0 - 10.5 K/uL 9.1  Hemoglobin 12.0 - 15.0 g/dL 15.6(H)  Hematocrit 36.0 - 46.0 % 44.3  Platelets 150 - 400 K/uL 258     RADIOGRAPHIC STUDIES: I have personally reviewed the radiological images as listed and agreed with the  findings in the report. No results found.  LABORATORY DATA:  I have reviewed the data as listed Lab Results  Component Value Date   WBC 9.1 12/15/2020   HGB 15.6 (H) 12/15/2020   HCT 44.3 12/15/2020   MCV 91.5 12/15/2020   PLT 258 12/15/2020   Recent Labs    12/15/20 1045  NA 138  K 3.8  CL 107  CO2 25  GLUCOSE 90  BUN 16  CREATININE 0.77  CALCIUM 9.1  GFRNONAA >60  PROT 7.5  ALBUMIN 4.0  AST 19  ALT 24  ALKPHOS 62  BILITOT 0.8    Iron/TIBC/Ferritin/ %Sat No results found for: IRON, TIBC, FERRITIN, IRONPCTSAT      ASSESSMENT & PLAN:  1. Erythrocytosis   2. Lymphocytosis   3. Family history of cancer   4. Moderate cigarette smoker (10-19 per day)    Labs are reviewed and discussed with patient. JAK2 V617F mutation negative, with reflex to other mutations CALR, MPL, JAK 2 Ex 12-15 mutations negative. BCR ABL1 FISH negative.  Elevated carbon monoxide, erythrocytosis is likely reactive, due to smoking.   Lymphocytosis, peripheral flowcytometry is negative. She is asymptomatic with no constitional symptoms. Likely reactive.  Current everyday smoker, discussed in details about smoke cessation.she is advised to follow up with lung cancer screening program and have annual low dose CT   Family history of cancer, I have referred her to genetic testing.  Recommend her to follow up with PCP. Discharge from our clinic.   All questions were answered. The patient knows to call the clinic with any problems questions or concerns.  Thank you for this kind referral and the opportunity to participate in the care of this patient. A copy of today's note is routed to referring provider   Rickard Patience, MD, PhD  12/29/2020

## 2021-01-03 ENCOUNTER — Telehealth (INDEPENDENT_AMBULATORY_CARE_PROVIDER_SITE_OTHER): Payer: BLUE CROSS/BLUE SHIELD | Admitting: Acute Care

## 2021-01-03 ENCOUNTER — Encounter: Payer: Self-pay | Admitting: Acute Care

## 2021-01-03 DIAGNOSIS — F17210 Nicotine dependence, cigarettes, uncomplicated: Secondary | ICD-10-CM | POA: Diagnosis not present

## 2021-01-03 NOTE — Progress Notes (Addendum)
Virtual Visit via Video Note  I connected with Chelsea [name] on 01/03/21 at  9:00 AM EDT by a video enabled telemedicine application and verified that I am speaking with the correct person using two identifiers.  Location: Patient: At home Provider: [address] [place], [place], [address]    I discussed the limitations of evaluation and management by telemedicine and the availability of in person appointments. The patient expressed understanding and agreed to proceed.  Shared Decision Making Visit Lung Cancer Screening Program [id number])   Eligibility: Age 59 y.o. Pack Years Smoking History Calculation 30 pack years (# packs/per year x # years smoked) Recent History of coughing up blood  no Unexplained weight loss? no ( >Than 15 pounds within the last 6 months ) Prior History Lung / other cancer no (Diagnosis within the last 5 years already requiring surveillance chest CT Scans). Smoking Status Current Smoker Former Smokers: Years since quit: NA  Quit Date: NA  Visit Components: Discussion included one or more decision making aids. yes Discussion included risk/benefits of screening. yes Discussion included potential follow up diagnostic testing for abnormal scans. yes Discussion included meaning and risk of over diagnosis. yes Discussion included meaning and risk of False Positives. yes Discussion included meaning of total radiation exposure. yes  Counseling Included: Importance of adherence to annual lung cancer LDCT screening. yes Impact of comorbidities on ability to participate in the program. yes Ability and willingness to under diagnostic treatment. yes  Smoking Cessation Counseling: Current Smokers:  Discussed importance of smoking cessation. yes Information about tobacco cessation classes and interventions provided to patient. yes Patient provided with "ticket" for LDCT Scan. yes Symptomatic Patient. no  Counseling Diagnosis Code: Tobacco Use  Z72.0 Asymptomatic Patient yes  Counseling (Intermediate counseling: > three minutes counseling) W1093 Former Smokers:  Discussed the importance of maintaining cigarette abstinence. yes Diagnosis Code: Personal History of Nicotine Dependence. A35.573 Information about tobacco cessation classes and interventions provided to patient. Yes Patient provided with "ticket" for LDCT Scan. yes Written Order for Lung Cancer Screening with LDCT placed in Epic. Yes (CT Chest Lung Cancer Screening Low Dose W/O CM) [id number] Z12.2-Screening of respiratory organs Z87.891-Personal history of nicotine dependence  I have spent 25 minutes of face to face time with Chelsea [name]  discussing the risks and benefits of lung cancer screening. We viewed a power point together that explained in detail the above noted topics. We paused at intervals to allow for questions to be asked and answered to ensure understanding.We discussed that the single most powerful action that she can take to decrease her risk of developing lung cancer is to quit smoking. We discussed whether or not she is ready to commit to setting a quit date. We discussed options for tools to aid in quitting smoking including nicotine replacement therapy, non-nicotine medications, support groups, Quit Smart classes, and behavior modification. We discussed that often times setting smaller, more achievable goals, such as eliminating 1 cigarette a day for a week and then 2 cigarettes a day for a week can be helpful in slowly decreasing the number of cigarettes smoked. This allows for a sense of accomplishment as well as providing a clinical benefit. I gave her the " Be Stronger Than Your Excuses" card with contact information for community resources, classes, free nicotine replacement therapy, and access to mobile apps, text messaging, and on-line smoking cessation help. I have also given her my card and contact information in the event she needs to contact me.  We  discussed the time and location of the scan, and that either Abigail Miyamoto RN or I will call with the results within 24-48 hours of receiving them. I have offered her  a copy of the power point we viewed  as a resource in the event they need reinforcement of the concepts we discussed today in the office. The patient verbalized understanding of all of  the above and had no further questions upon leaving the office. They have my contact information in the event they have any further questions.  I spent 3 minutes counseling on smoking cessation and the health risks of continued tobacco abuse.  I explained to the patient that there has been a high incidence of coronary artery disease noted on these exams. I explained that this is a non-gated exam therefore degree or severity cannot be determined. This patient is on statin therapy. I have asked the patient to follow-up with their PCP regarding any incidental finding of coronary artery disease and management with diet or medication as their PCP  feels is clinically indicated. The patient verbalized understanding of the above and had no further questions upon completion of the visit.  Pt. Is on Chantix and has cut from 1/2 PPD to 10 cigarettes daily. She is also using nicotine replacement therapy.       Bevelyn Ngo, NP 01/03/2021

## 2021-01-03 NOTE — Patient Instructions (Signed)
Thank you for participating in the [facility]. It was our pleasure to meet you today. We will call you with the results of your scan within the next few days. Your scan will be assigned a Lung RADS category score by the physicians reading the scans.  This Lung RADS score determines follow up scanning.  See below for description of categories, and follow up screening recommendations. We will be in touch to schedule your follow up screening annually or based on recommendations of our providers. We will fax a copy of your scan results to your Primary Care Physician, or the physician who referred you to the program, to ensure they have the results. Please call the office if you have any questions or concerns regarding your scanning experience or results.  Our office number is [phone]. Please speak with Denise Phelps, RN. She is our Lung Cancer Screening RN. If she is unavailable when you call, please have the office staff send her a message. She will return your call at her earliest convenience. Remember, if your scan is normal, we will scan you annually as long as you continue to meet the criteria for the program. (Age 55-77, Current smoker or smoker who has quit within the last 15 years). If you are a smoker, remember, quitting is the single most powerful action that you can take to decrease your risk of lung cancer and other pulmonary, breathing related problems. We know quitting is hard, and we are here to help.  Please let us know if there is anything we can do to help you meet your goal of quitting. If you are a former smoker, congratulations. We are proud of you! Remain smoke free! Remember you can refer friends or family members through the number above.  We will screen them to make sure they meet criteria for the program. Thank you for helping us take better care of you by participating in Lung Screening.  Lung RADS Categories:  Lung RADS 1: no nodules  or definitely non-concerning nodules.  Recommendation is for a repeat annual scan in 12 months.  Lung RADS 2:  nodules that are non-concerning in appearance and behavior with a very low likelihood of becoming an active cancer. Recommendation is for a repeat annual scan in 12 months.  Lung RADS 3: nodules that are probably non-concerning , includes nodules with a low likelihood of becoming an active cancer.  Recommendation is for a 6-month repeat screening scan. Often noted after an upper respiratory illness. We will be in touch to make sure you have no questions, and to schedule your 6-month scan.  Lung RADS 4 A: nodules with concerning findings, recommendation is most often for a follow up scan in 3 months or additional testing based on our provider's assessment of the scan. We will be in touch to make sure you have no questions and to schedule the recommended 3 month follow up scan.  Lung RADS 4 B:  indicates findings that are concerning. We will be in touch with you to schedule additional diagnostic testing based on our provider's  assessment of the scan.   

## 2021-01-05 ENCOUNTER — Other Ambulatory Visit: Payer: Self-pay

## 2021-01-05 ENCOUNTER — Ambulatory Visit
Admission: RE | Admit: 2021-01-05 | Discharge: 2021-01-05 | Disposition: A | Discharge status: Home / Self Care | Payer: BLUE CROSS/BLUE SHIELD | Source: Ambulatory Visit | Attending: Acute Care | Admitting: Acute Care

## 2021-01-05 DIAGNOSIS — F17210 Nicotine dependence, cigarettes, uncomplicated: Secondary | ICD-10-CM | POA: Insufficient documentation

## 2021-01-10 ENCOUNTER — Telehealth: Payer: Self-pay | Admitting: Acute Care

## 2021-01-10 DIAGNOSIS — F17210 Nicotine dependence, cigarettes, uncomplicated: Secondary | ICD-10-CM

## 2021-01-10 DIAGNOSIS — R911 Solitary pulmonary nodule: Secondary | ICD-10-CM

## 2021-01-10 NOTE — Progress Notes (Signed)
Please see telephone note 01/10/2021. Thanks

## 2021-01-10 NOTE — Telephone Encounter (Signed)
I received a call from pt requesting lung screening CT results. Will forward to Kandice Robinsons, NP to advise on results.

## 2021-01-10 NOTE — Telephone Encounter (Signed)
I have called the patient with the results of her low dose CT. It was read as a Lung RADS 4 A : suspicious findings, either short term follow up in 3 months or alternatively  PET Scan evaluation [place] be considered when there is a solid component of  8 mm or larger. I explained that we usually do a 3 month follow up low dose CT. She has a significant lung cancer family history and she is very anxious about waiting 3 months for a follow up scan.  Dr. Reece Agar, can you take a look at her scan and see what you think?  Thanks so much.  Angelique Blonder , please fax results to PCP, we will let you know if there is a follow up CT in 3 months.  Thank You!!

## 2021-01-10 NOTE — Telephone Encounter (Signed)
IMPRESSION: Irregular solid nodular opacity of the left lower lobe located adjacent to the diaphragm with associated bronchiectasis measuring 14.6 mm in mean diameter, possibly an area of post infectious scarring, but technically, Lung-RADS 4A, suspicious. Follow up low-dose chest CT without contrast in 3 months (please use the following order, "CT CHEST LCS NODULE FOLLOW-UP W/O CM") is recommended. Alternatively, PET [place] be considered when there is a solid component 12mm or larger.   Aortic Atherosclerosis (ICD10-I70.0) and Emphysema (ICD10-J43.9).

## 2021-01-11 ENCOUNTER — Telehealth: Payer: Self-pay | Admitting: Licensed Clinical Social Worker

## 2021-01-11 ENCOUNTER — Ambulatory Visit: Payer: Self-pay | Admitting: Licensed Clinical Social Worker

## 2021-01-11 ENCOUNTER — Encounter: Payer: Self-pay | Admitting: Licensed Clinical Social Worker

## 2021-01-11 DIAGNOSIS — Z801 Family history of malignant neoplasm of trachea, bronchus and lung: Secondary | ICD-10-CM

## 2021-01-11 DIAGNOSIS — Z806 Family history of leukemia: Secondary | ICD-10-CM

## 2021-01-11 DIAGNOSIS — Z8051 Family history of malignant neoplasm of kidney: Secondary | ICD-10-CM

## 2021-01-11 DIAGNOSIS — Z800 Family history of malignant neoplasm of digestive organs: Secondary | ICD-10-CM

## 2021-01-11 DIAGNOSIS — Z8041 Family history of malignant neoplasm of ovary: Secondary | ICD-10-CM

## 2021-01-11 DIAGNOSIS — Z1379 Encounter for other screening for genetic and chromosomal anomalies: Secondary | ICD-10-CM

## 2021-01-11 DIAGNOSIS — Z808 Family history of malignant neoplasm of other organs or systems: Secondary | ICD-10-CM

## 2021-01-11 NOTE — Telephone Encounter (Signed)
Revealed negative genetic testing.  This normal result is reassuring.  It is unlikely that there is an increased risk of cancer due to a mutation in one of these genes.  However, genetic testing is not perfect, and cannot definitively rule out a hereditary cause.  It will be important for her to keep in contact with genetics to learn if any additional testing [place] be needed in the future.      

## 2021-01-11 NOTE — Telephone Encounter (Signed)
Lm for patient to schedule 20mo rov.

## 2021-01-11 NOTE — Progress Notes (Signed)
HPI:  Chelsea [name] was previously seen in the [facility] clinic due to a family history of cancer and concerns regarding a hereditary predisposition to cancer. Please refer to our prior cancer genetics clinic note for more information regarding our discussion, assessment and recommendations, at the time. Chelsea [name] recent genetic test results were disclosed to her, as were recommendations warranted by these results. These results and recommendations are discussed in more detail below.  CANCER HISTORY:  Oncology History   No history exists.    FAMILY HISTORY:  We obtained a detailed, 4-generation family history.  Significant diagnoses are listed below: Family History  Problem Relation Age of Onset   Diabetes Mother    Stroke Mother    Aneurysm Mother    Lung cancer Father    Emphysema Father    Stroke Sister    Bone cancer Sister    Diabetes Brother    Leukemia Brother    Diabetes Brother    Ovarian cancer Maternal Aunt    Stroke Maternal Uncle    Lung cancer Paternal Uncle    Brain cancer Paternal Uncle    Liver cancer Paternal Uncle    Lung cancer Maternal Grandfather    Lung cancer Paternal Grandmother    Heart attack Paternal Grandfather    Chelsea [name] has 2 sons (94 and 64). She has 2 brothers and 2 sisters. One brother had leukemia and died at 40. A sister had osteosarcoma of her clavicle in her 15s and is living at 2.    Chelsea [name] mother died at 32. Patient had 1 maternal aunt, 1 maternal uncle. Her aunt had ovarian cancer and died in her 61s. No known cousins with cancer. Maternal grandmother died in her 64s, grandfather died of lung cancer over the age of 80 and had history of smoking.   Chelsea [name] father died at 79 of lung cancer, he had history of smoking. Patient had 4 paternal uncles. 2 had lung cancer. One had liver cancer and died in his late 63s. A paternal cousin had stomach cancer and died of it in her 60s, and her brother had kidney cancer recently at 22.  Paternal grandmother had lung cancer as well at 3 and passed of it. Paternal grandfather died at 48 of heart issues.    Chelsea [name] is unaware of previous family history of genetic testing for hereditary cancer risks. Patient's maternal ancestors are of English descent, and paternal ancestors are of unknown descent. There is no reported Ashkenazi Jewish ancestry. There is no known consanguinity.      GENETIC TEST RESULTS: Genetic testing reported out on 01/10/2021 through the [facility]+RNA cancer panel found no pathogenic mutations.   The CustomNext-Cancer + RNAinsight panel  includes sequencing and/or deletion duplication testing of the following 91 genes: AIP, ALK, APC*, ATM*, AXIN2, BAP1, BARD1, BLM, BMPR1A, BRCA1*, BRCA2*, BRIP1*, CDC73, CDH1*, CDK4, CDKN1B, CDKN2A, CHEK2*, CTNNA1, DICER1, FANCC, FH, FLCN, GALNT12, KIF1B, LZTR1, MAX, MEN1, MET, MLH1*, MRE11A, MSH2*, MSH3, MSH6*, MUTYH*, NBN, NF1*, NF2, NTHL1, PALB2*, PHOX2B, PMS2*, POT1, PRKAR1A, PTCH1, PTEN*, RAD50, RAD51C*, RAD51D*, RB1, RECQL, RET, SDHA, SDHAF2, SDHB, SDHC, SDHD, SMAD4, SMARCA4, SMARCB1, SMARCE1, STK11, SUFU, TMEM127, TP53*, TSC1, TSC2, VHL and XRCC2 (sequencing and deletion/duplication); CASR, CFTR, CPA1, CTRC, EGFR, EGLN1, FAM175A, HOXB13, KIT, MITF, MLH3, PALLD, PDGFRA, POLD1, POLE, PRSS1, RINT1, RPS20, SPINK1 and TERT (sequencing only); EPCAM and GREM1 (deletion/duplication only). .   The test report has been scanned into EPIC and is located under the Molecular Pathology  section of the Results Review tab.  A portion of the result report is included below for reference.     We discussed that because current genetic testing is not perfect, it is possible there [place] be a gene mutation in one of these genes that current testing cannot detect, but that chance is small.  There could be another gene that has not yet been discovered, or that we have not yet tested, that is responsible for the cancer diagnoses in the family. It  is also possible there is a hereditary cause for the cancer in the family that Chelsea [name] did not inherit and therefore was not identified in her testing.  Therefore, it is important to remain in touch with cancer genetics in the future so that we can continue to offer Chelsea [name] the most up to date genetic testing.   ADDITIONAL GENETIC TESTING: We discussed with Chelsea [name] that her genetic testing was fairly extensive.  If there are genes identified to increase cancer risk that can be analyzed in the future, we would be happy to discuss and coordinate this testing at that time.    CANCER SCREENING RECOMMENDATIONS: Chelsea [name] test result is considered negative (normal).  This means that we have not identified a hereditary cause for her family history of cancer at this time.   While reassuring, this does not definitively rule out a hereditary predisposition to cancer. It is still possible that there could be genetic mutations that are undetectable by current technology. There could be genetic mutations in genes that have not been tested or identified to increase cancer risk.  Therefore, it is recommended she continue to follow the cancer management and screening guidelines provided by her primary healthcare provider.   An individual's cancer risk and medical management are not determined by genetic test results alone. Overall cancer risk assessment incorporates additional factors, including personal medical history, family history, and any available genetic information that [name] result in a personalized plan for cancer prevention and surveillance.  RECOMMENDATIONS FOR FAMILY MEMBERS:  Relatives in this family might be at some increased risk of developing cancer, over the general population risk, simply due to the family history of cancer.  We recommended female relatives in this family have a yearly mammogram beginning at age 62, or 52 years younger than the earliest onset of cancer, an annual clinical breast exam,  and perform monthly breast self-exams. Female relatives in this family should also have a gynecological exam as recommended by their primary provider.  All family members should be referred for colonoscopy starting at age 20.    It is also possible there is a hereditary cause for the cancer in Chelsea [name] family that she did not inherit and therefore was not identified in her.  Based on Chelsea [name] family history, we recommended her sister who had osteosarcoma and maternal relatives have genetic counseling and testing. Chelsea [name] will let us know if we can be of any assistance in coordinating genetic counseling and/or testing for these family members.  FOLLOW-UP: Lastly, we discussed with Ms. Cranshaw that cancer genetics is a rapidly advancing field and it is possible that new genetic tests will be appropriate for her and/or her family members in the future. We encouraged her to remain in contact with cancer genetics on an annual basis so we can update her personal and family histories and let her know of advances in cancer genetics that [place] benefit this family.   Our contact number was provided. Ms.  Vandezande's questions were answered to her satisfaction, and she knows she is welcome to call us at anytime with additional questions or concerns.   Faith Rogue, MS, [facility] Genetic Counselor [email or web address] Phone: [phone]

## 2021-01-11 NOTE — Addendum Note (Signed)
Addended by: Abigail Miyamoto D on: 01/11/2021 10:46 AM   Modules accepted: Orders

## 2021-01-11 NOTE — Telephone Encounter (Signed)
Order placed for 3 mth nodule f/u CT.

## 2021-01-11 NOTE — Telephone Encounter (Signed)
Follow-up CT in 3 months reasonable.  Also set up with with me at [facility]

## 2021-01-11 NOTE — Telephone Encounter (Signed)
See other telephone note 01/10/21.

## 2021-01-11 NOTE — Telephone Encounter (Signed)
Appt scheduled for 01/26/2021 with Dr. Tonia Brooms.

## 2021-01-14 ENCOUNTER — Other Ambulatory Visit: Payer: Self-pay | Admitting: Family Medicine

## 2021-01-14 DIAGNOSIS — R911 Solitary pulmonary nodule: Secondary | ICD-10-CM

## 2021-01-24 ENCOUNTER — Ambulatory Visit
Admission: RE | Admit: 2021-01-24 | Discharge: 2021-01-24 | Disposition: A | Discharge status: Home / Self Care | Payer: BLUE CROSS/BLUE SHIELD | Source: Ambulatory Visit | Attending: Acute Care | Admitting: Acute Care

## 2021-01-24 ENCOUNTER — Other Ambulatory Visit: Payer: Self-pay

## 2021-01-24 DIAGNOSIS — F17210 Nicotine dependence, cigarettes, uncomplicated: Secondary | ICD-10-CM

## 2021-01-24 DIAGNOSIS — R911 Solitary pulmonary nodule: Secondary | ICD-10-CM

## 2021-01-26 ENCOUNTER — Other Ambulatory Visit: Payer: Self-pay

## 2021-01-26 ENCOUNTER — Encounter: Payer: Self-pay | Admitting: Pulmonary Disease

## 2021-01-26 ENCOUNTER — Ambulatory Visit (INDEPENDENT_AMBULATORY_CARE_PROVIDER_SITE_OTHER): Payer: BLUE CROSS/BLUE SHIELD | Admitting: Pulmonary Disease

## 2021-01-26 VITALS — BP 106/54 | HR 90 | Temp 97.8°F | Ht 62.0 in | Wt 200.4 lb

## 2021-01-26 DIAGNOSIS — Z808 Family history of malignant neoplasm of other organs or systems: Secondary | ICD-10-CM

## 2021-01-26 DIAGNOSIS — R911 Solitary pulmonary nodule: Secondary | ICD-10-CM

## 2021-01-26 DIAGNOSIS — R918 Other nonspecific abnormal finding of lung field: Secondary | ICD-10-CM | POA: Diagnosis not present

## 2021-01-26 DIAGNOSIS — Z8051 Family history of malignant neoplasm of kidney: Secondary | ICD-10-CM

## 2021-01-26 DIAGNOSIS — F17210 Nicotine dependence, cigarettes, uncomplicated: Secondary | ICD-10-CM | POA: Diagnosis not present

## 2021-01-26 DIAGNOSIS — Z801 Family history of malignant neoplasm of trachea, bronchus and lung: Secondary | ICD-10-CM | POA: Diagnosis not present

## 2021-01-26 DIAGNOSIS — Z806 Family history of leukemia: Secondary | ICD-10-CM

## 2021-01-26 DIAGNOSIS — Z8041 Family history of malignant neoplasm of ovary: Secondary | ICD-10-CM

## 2021-01-26 DIAGNOSIS — Z800 Family history of malignant neoplasm of digestive organs: Secondary | ICD-10-CM

## 2021-01-26 NOTE — Progress Notes (Signed)
Synopsis: Referred in October 2022 for lung nodule by Jerl Mina, MD  Subjective:   PATIENT ID: Chelsea [name] GENDER: female DOB: 10-Dec-1960, MRN: [id number]  Chief Complaint  Patient presents with   Consult    Pt. Says she wants to talk about lung nodule    This is a 60 year old female, family history of bone cancer, leukemia, kidney cancer, lung cancer, ovarian cancer, stomach cancer.  She was enrolled in our lung cancer screening program as she is a longstanding smoker.  Found to have a 14 mm left lower lobe pulmonary nodule.  Patient was referred for evaluation regarding this.  Also counseled heavily today on smoking cessation.  She has been seen by primary care and had a PET scan ordered which is scheduled for tomorrow.   Past Medical History:  Diagnosis Date   Allergy    Anxiety    Depression    Diabetes mellitus without complication (HCC)    Family history of bone cancer    Family history of kidney cancer    Family history of leukemia    Family history of lung cancer    Family history of ovarian cancer    Family history of stomach cancer    Hyperlipidemia    Hypertension    Panic disorder    Peripheral neuropathy    Sleep apnea    Vitamin B12 deficiency    Vitamin D deficiency      Family History  Problem Relation Age of Onset   Diabetes Mother    Stroke Mother    Aneurysm Mother    Lung cancer Father    Emphysema Father    Stroke Sister    Bone cancer Sister    Diabetes Brother    Leukemia Brother    Diabetes Brother    Ovarian cancer Maternal Aunt    Stroke Maternal Uncle    Lung cancer Paternal Uncle    Brain cancer Paternal Uncle    Liver cancer Paternal Uncle    Lung cancer Maternal Grandfather    Lung cancer Paternal Grandmother    Heart attack Paternal Grandfather      Past Surgical History:  Procedure Laterality Date   ABDOMINAL HYSTERECTOMY     nose     TUBAL LIGATION      Social History   Socioeconomic History   Marital  status: Married    Spouse name: Not on file   Number of children: Not on file   Years of education: Not on file   Highest education level: Not on file  Occupational History   Not on file  Tobacco Use   Smoking status: Every Day    Packs/day: 0.50    Years: 44.00    Pack years: 22.00    Types: Cigarettes   Smokeless tobacco: Never  Vaping Use   Vaping Use: Never used  Substance and Sexual Activity   Alcohol use: Yes    Comment: 1 once a month /very rare   Drug use: No   Sexual activity: Not on file  Other Topics Concern   Not on file  Social History Narrative   Not on file   Social Determinants of Health   Financial Resource Strain: Not on file  Food Insecurity: Not on file  Transportation Needs: Not on file  Physical Activity: Not on file  Stress: Not on file  Social Connections: Not on file  Intimate Partner Violence: Not on file     Allergies  Allergen Reactions  Codeine     Hallicutations      Outpatient Medications Prior to Visit  Medication Sig Dispense Refill   acetaminophen (TYLENOL) 500 MG tablet Take by mouth.     albuterol (VENTOLIN HFA) 108 (90 Base) MCG/ACT inhaler Inhale into the lungs.     amitriptyline (ELAVIL) 10 MG tablet TAKE 1 TO 2 TABLETS BY MOUTH AT BEDTIME     carvedilol (COREG) 6.25 MG tablet Take 6.25 mg by mouth 2 (two) times daily.     cyanocobalamin 1000 MCG tablet Take 1,000 mcg by mouth daily.     furosemide (LASIX) 20 MG tablet Take by mouth.     levothyroxine (SYNTHROID) 25 MCG tablet Take by mouth.     LORazepam (ATIVAN) 0.5 MG tablet Take by mouth.     losartan (COZAAR) 100 MG tablet Take 100 mg daily by mouth.  10   nicotine (NICODERM CQ - DOSED IN MG/24 HOURS) 21 mg/24hr patch Place 21 mg onto the skin daily.     Omega-3 Fatty Acids (FISH OIL PO) Take by mouth.     PARoxetine (PAXIL) 40 MG tablet Take by mouth.     Probiotic Product (PROBIOTIC DAILY PO) Take 1 capsule by mouth daily.     pyridoxine (B-6) 100 MG tablet Take  100 mg by mouth daily.     rosuvastatin (CRESTOR) 10 MG tablet Take 10 mg by mouth daily.     traMADol (ULTRAM) 50 MG tablet Take by mouth.     varenicline (CHANTIX) 1 MG tablet Take 1 mg by mouth 2 (two) times daily.     Vitamin D3 (VITAMIN D) 25 MCG tablet Take 1,000 Units by mouth daily.     gabapentin (NEURONTIN) 600 MG tablet Take 1 tablet (600 mg total) by mouth every 8 (eight) hours. 90 tablet 2   glipiZIDE (GLUCOTROL XL) 2.5 MG 24 hr tablet Take by mouth.     topiramate (TOPAMAX) 50 MG tablet Take by mouth.     No facility-administered medications prior to visit.    Review of Systems  Constitutional:  Negative for chills, fever, malaise/fatigue and weight loss.  HENT:  Negative for hearing loss, sore throat and tinnitus.   Eyes:  Negative for blurred vision and double vision.  Respiratory:  Positive for cough and shortness of breath. Negative for hemoptysis, sputum production, wheezing and stridor.   Cardiovascular:  Negative for chest pain, palpitations, orthopnea, leg swelling and PND.  Gastrointestinal:  Negative for abdominal pain, constipation, diarrhea, heartburn, nausea and vomiting.  Genitourinary:  Negative for dysuria, hematuria and urgency.  Musculoskeletal:  Negative for joint pain and myalgias.  Skin:  Negative for itching and rash.  Neurological:  Negative for dizziness, tingling, weakness and headaches.  Endo/Heme/Allergies:  Negative for environmental allergies. Does not bruise/bleed easily.  Psychiatric/Behavioral:  Negative for depression. The patient is not nervous/anxious and does not have insomnia.   All other systems reviewed and are negative.   Objective:  Physical Exam Vitals reviewed.  Constitutional:      General: She is not in acute distress.    Appearance: She is well-developed. She is obese.  HENT:     Head: Normocephalic and atraumatic.  Eyes:     General: No scleral icterus.    Conjunctiva/sclera: Conjunctivae normal.     Pupils: Pupils  are equal, round, and reactive to light.  Neck:     Vascular: No JVD.     Trachea: No tracheal deviation.  Cardiovascular:     Rate and  Rhythm: Normal rate and regular rhythm.     Heart sounds: Normal heart sounds. No murmur heard. Pulmonary:     Effort: Pulmonary effort is normal. No tachypnea, accessory muscle usage or respiratory distress.     Breath sounds: Normal breath sounds. No stridor. No wheezing, rhonchi or rales.  Abdominal:     General: Bowel sounds are normal. There is no distension.     Palpations: Abdomen is soft.     Tenderness: There is no abdominal tenderness.  Musculoskeletal:        General: No tenderness.     Cervical back: Neck supple.  Lymphadenopathy:     Cervical: No cervical adenopathy.  Skin:    General: Skin is warm and dry.     Capillary Refill: Capillary refill takes less than 2 seconds.     Findings: No rash.  Neurological:     Mental Status: She is alert and oriented to person, place, and time.  Psychiatric:        Behavior: Behavior normal.     Vitals:   01/26/21 1035  BP: (!) 106/54  Pulse: 90  Temp: 97.8 F (36.6 C)  TempSrc: Oral  SpO2: 100%  Weight: 200 lb 6.4 oz (90.9 kg)  Height: 5\' 2"  (1.575 m)   100% on  RA BMI Readings from Last 3 Encounters:  01/26/21 36.65 kg/m  01/05/21 35.64 kg/m  12/29/20 36.25 kg/m   Wt Readings from Last 3 Encounters:  01/26/21 200 lb 6.4 oz (90.9 kg)  01/05/21 198 lb (89.8 kg)  12/29/20 198 lb 3.2 oz (89.9 kg)     CBC    Component Value Date/Time   WBC 9.1 12/15/2020 1045   RBC 4.84 12/15/2020 1045   HGB 15.6 (H) 12/15/2020 1045   HGB 14.3 04/30/2011 1034   HCT 44.3 12/15/2020 1045   HCT 40.7 04/30/2011 1034   PLT 258 12/15/2020 1045   PLT 381 04/30/2011 1034   MCV 91.5 12/15/2020 1045   MCV 95 04/30/2011 1034   MCH 32.2 12/15/2020 1045   MCHC 35.2 12/15/2020 1045   RDW 12.7 12/15/2020 1045   RDW 12.5 04/30/2011 1034   LYMPHSABS 4.7 (H) 12/15/2020 1045   MONOABS 0.5  12/15/2020 1045   EOSABS 0.2 12/15/2020 1045   BASOSABS 0.1 12/15/2020 1045     Chest Imaging: October 2022's lung cancer screening CT: 14 mm left lower lobe pulmonary nodule The patient's images have been independently reviewed by me.    Pulmonary Functions Testing Results: No flowsheet data found.  FeNO:   Pathology:   Echocardiogram:   Heart Catheterization:     Assessment & Plan:     ICD-10-CM   1. Lung nodule  R91.1     2. Cigarette smoker  F17.210     3. Abnormal CT lung screening  R91.8     4. Family history of lung cancer  Z80.1     5. Family history of bone cancer  Z80.8     6. Family history of kidney cancer  Z80.51     7. Family history of stomach cancer  Z80.0     8. Family history of leukemia  Z80.6     9. Family history of ovarian cancer  Z80.41       Discussion:  This is a 60 year old female, current tobacco use, down to half a pack of cigarettes per day.  She has a lung nodule in the lower lobe which is adjacent to the fissure there is also an  area of bronchiectasis associated with this.  She had a abnormal lung cancer screening CT and was referred for evaluation.  She is super anxious about this due to her family history of multiple malignancies as defined above.  Plan: She needs a PET scan which I think will help [place] determine probability of malignancy for the lesion. This looks radiographically like an inflammatory type lesion.  No matter the case she will need at least a 39-month CT noncontrast follow-up. If the PET scan is super concerning for malignancy then I think potentially next best steps would be consideration for biopsy. We also discussed direct resection and referral to thoracic surgery. I think that surgery would ultimately be the best option for her but I do think she needs to cut down on the quantity of cigarettes that she is smoking before consideration of resection. Based on the PET scan I will call her and discuss this further  and consider referral to thoracic surgery or even consideration for combination case with our surgical colleagues.  Patient is agreeable to this plan.  We will come up with neck steps based on PET scan which is planned for tomorrow I will call her on Friday afternoon to discuss further or potentially Monday of next week.    Current Outpatient Medications:    acetaminophen (TYLENOL) 500 MG tablet, Take by mouth., Disp: , Rfl:    albuterol (VENTOLIN HFA) 108 (90 Base) MCG/ACT inhaler, Inhale into the lungs., Disp: , Rfl:    amitriptyline (ELAVIL) 10 MG tablet, TAKE 1 TO 2 TABLETS BY MOUTH AT BEDTIME, Disp: , Rfl:    carvedilol (COREG) 6.25 MG tablet, Take 6.25 mg by mouth 2 (two) times daily., Disp: , Rfl:    cyanocobalamin 1000 MCG tablet, Take 1,000 mcg by mouth daily., Disp: , Rfl:    furosemide (LASIX) 20 MG tablet, Take by mouth., Disp: , Rfl:    levothyroxine (SYNTHROID) 25 MCG tablet, Take by mouth., Disp: , Rfl:    LORazepam (ATIVAN) 0.5 MG tablet, Take by mouth., Disp: , Rfl:    losartan (COZAAR) 100 MG tablet, Take 100 mg daily by mouth., Disp: , Rfl: 10   nicotine (NICODERM CQ - DOSED IN MG/24 HOURS) 21 mg/24hr patch, Place 21 mg onto the skin daily., Disp: , Rfl:    Omega-3 Fatty Acids (FISH OIL PO), Take by mouth., Disp: , Rfl:    PARoxetine (PAXIL) 40 MG tablet, Take by mouth., Disp: , Rfl:    Probiotic Product (PROBIOTIC DAILY PO), Take 1 capsule by mouth daily., Disp: , Rfl:    pyridoxine (B-6) 100 MG tablet, Take 100 mg by mouth daily., Disp: , Rfl:    rosuvastatin (CRESTOR) 10 MG tablet, Take 10 mg by mouth daily., Disp: , Rfl:    traMADol (ULTRAM) 50 MG tablet, Take by mouth., Disp: , Rfl:    varenicline (CHANTIX) 1 MG tablet, Take 1 mg by mouth 2 (two) times daily., Disp: , Rfl:    Vitamin D3 (VITAMIN D) 25 MCG tablet, Take 1,000 Units by mouth daily., Disp: , Rfl:    gabapentin (NEURONTIN) 600 MG tablet, Take 1 tablet (600 mg total) by mouth every 8 (eight) hours., Disp:  90 tablet, Rfl: 2   glipiZIDE (GLUCOTROL XL) 2.5 MG 24 hr tablet, Take by mouth., Disp: , Rfl:    topiramate (TOPAMAX) 50 MG tablet, Take by mouth., Disp: , Rfl:   I spent 63 minutes dedicated to the care of this patient on the date of this  encounter to include pre-visit review of records, face-to-face time with the patient discussing conditions above, post visit ordering of testing, clinical documentation with the electronic health record, making appropriate referrals as documented, and communicating necessary findings to members of the patients care team.   Josephine Igo, DO [facility] 01/26/2021 12:26 PM

## 2021-01-26 NOTE — Patient Instructions (Signed)
Thank you for visiting Dr. Tonia Brooms at [facility]. Today we recommend the following:  PET scan tomorrow We will come up with a plan Pending results we will likely need repeat CT Chest in 3 months either way.   Return in about 3 months (around 04/28/2021) for with Kandice Robinsons, NP, or Dr. Tonia Brooms.    Please do your part to reduce the spread of COVID-19.

## 2021-01-27 ENCOUNTER — Ambulatory Visit
Admission: RE | Admit: 2021-01-27 | Discharge: 2021-01-27 | Disposition: A | Discharge status: Home / Self Care | Payer: BLUE CROSS/BLUE SHIELD | Source: Ambulatory Visit | Attending: Family Medicine | Admitting: Family Medicine

## 2021-01-27 DIAGNOSIS — R911 Solitary pulmonary nodule: Secondary | ICD-10-CM | POA: Insufficient documentation

## 2021-01-27 DIAGNOSIS — Z131 Encounter for screening for diabetes mellitus: Secondary | ICD-10-CM | POA: Insufficient documentation

## 2021-01-27 LAB — GLUCOSE, CAPILLARY: Glucose-Capillary: 129 mg/dL — ABNORMAL HIGH (ref 70–99)

## 2021-01-27 MED ORDER — FLUDEOXYGLUCOSE F - 18 (FDG) INJECTION
10.3000 | Freq: Once | INTRAVENOUS | Status: AC | PRN
  Administered 2021-01-27: 10.9 via INTRAVENOUS

## 2021-04-14 ENCOUNTER — Ambulatory Visit: Payer: BLUE CROSS/BLUE SHIELD | Admitting: Pulmonary Disease

## 2021-04-22 ENCOUNTER — Ambulatory Visit: Admission: RE | Admit: 2021-04-22 | Payer: BLUE CROSS/BLUE SHIELD | Source: Ambulatory Visit

## 2021-06-22 ENCOUNTER — Other Ambulatory Visit: Payer: Self-pay | Admitting: *Deleted

## 2021-06-22 DIAGNOSIS — F17210 Nicotine dependence, cigarettes, uncomplicated: Secondary | ICD-10-CM

## 2021-07-11 ENCOUNTER — Other Ambulatory Visit: Payer: Self-pay | Admitting: Family Medicine

## 2021-07-11 DIAGNOSIS — Z1231 Encounter for screening mammogram for malignant neoplasm of breast: Secondary | ICD-10-CM

## 2021-07-25 ENCOUNTER — Telehealth: Payer: Self-pay | Admitting: *Deleted

## 2021-07-25 NOTE — Telephone Encounter (Signed)
Called and spoke with pt and advised that she will not be due for another lung screening scan until 01/28/2022 due to her having a PET scan 01/28/2021. Pt verbalized understanding and is aware we will call her back closer to 01/2022 to schedule her next CT.  ?

## 2021-08-19 ENCOUNTER — Ambulatory Visit
Admission: RE | Admit: 2021-08-19 | Discharge: 2021-08-19 | Disposition: A | Discharge status: Home / Self Care | Payer: BLUE CROSS/BLUE SHIELD | Source: Ambulatory Visit | Attending: Family Medicine | Admitting: Family Medicine

## 2021-08-19 DIAGNOSIS — Z1231 Encounter for screening mammogram for malignant neoplasm of breast: Secondary | ICD-10-CM | POA: Diagnosis present

## 2022-01-30 ENCOUNTER — Ambulatory Visit
Admission: RE | Admit: 2022-01-30 | Discharge: 2022-01-30 | Disposition: A | Discharge status: Home / Self Care | Payer: BLUE CROSS/BLUE SHIELD | Source: Ambulatory Visit | Attending: Family Medicine | Admitting: Family Medicine

## 2022-01-30 ENCOUNTER — Encounter (INDEPENDENT_AMBULATORY_CARE_PROVIDER_SITE_OTHER): Payer: Self-pay

## 2022-01-30 DIAGNOSIS — F17210 Nicotine dependence, cigarettes, uncomplicated: Secondary | ICD-10-CM | POA: Diagnosis present

## 2022-02-10 ENCOUNTER — Telehealth: Payer: Self-pay | Admitting: Acute Care

## 2022-02-10 DIAGNOSIS — F17210 Nicotine dependence, cigarettes, uncomplicated: Secondary | ICD-10-CM

## 2022-02-10 DIAGNOSIS — R911 Solitary pulmonary nodule: Secondary | ICD-10-CM

## 2022-02-10 DIAGNOSIS — Z87891 Personal history of nicotine dependence: Secondary | ICD-10-CM

## 2022-02-10 NOTE — Telephone Encounter (Signed)
I have called the patient with the results of her low dose CT Chest. I explained that while the radiology read recommended a 49-month follow-up since there has been interval growth of a groundglass area from 7.8 mm to 18.3 mm we will repeat the scan in 6 months. I had Dr. 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  We will err on the side of caution and recheck the area in 6 months. Patient is in agreement with this plan, I explained that she will get a call closer to the time to get this scheduled.  Denise please place order for 2-month follow-up low-dose screening CT and fax results to PCP.  Thanks so much

## 2022-02-13 NOTE — Telephone Encounter (Signed)
Order placed for 6 month follow up CT chest for nodule and results/plan faxed to PCP

## 2022-08-01 ENCOUNTER — Ambulatory Visit
Admission: RE | Admit: 2022-08-01 | Discharge: 2022-08-01 | Disposition: A | Discharge status: Home / Self Care | Payer: BLUE CROSS/BLUE SHIELD | Source: Ambulatory Visit | Attending: Acute Care | Admitting: Acute Care

## 2022-08-01 ENCOUNTER — Ambulatory Visit: Payer: BLUE CROSS/BLUE SHIELD

## 2022-08-01 DIAGNOSIS — Z87891 Personal history of nicotine dependence: Secondary | ICD-10-CM

## 2022-08-01 DIAGNOSIS — F17210 Nicotine dependence, cigarettes, uncomplicated: Secondary | ICD-10-CM

## 2022-08-01 DIAGNOSIS — R911 Solitary pulmonary nodule: Secondary | ICD-10-CM | POA: Diagnosis present

## 2022-08-04 ENCOUNTER — Telehealth: Payer: Self-pay | Admitting: *Deleted

## 2022-08-04 NOTE — Telephone Encounter (Signed)
Call report. [facility]. Pls call @ [phone]

## 2022-08-04 NOTE — Telephone Encounter (Signed)
Call report on LDCT 08/01/22:  IMPRESSION: 1. Lung-RADS 3, probably benign findings. Interval progression of scattered tree-in-bud nodularity in the posterior and paraspinal left lower lobe with nodular components measuring up to 5.1 mm. Likely infectious/inflammatory in etiology, including atypical infection, short-term follow-up in 6 months is recommended with repeat low-dose chest CT without contrast (please use the following order, "CT CHEST LCS NODULE FOLLOW-UP W/O CM"). 2. Hepatic steatosis. 3. Aortic Atherosclerosis (ICD10-I70.0) and Emphysema (ICD10-J43.9).   These results will be called to the ordering clinician or representative by the Radiologist Assistant, and communication documented in the PACS or [employer].     Electronically Signed   By: Kennith Center M.D.   On: 08/04/2022 09:21  Routing to Maralyn Sago, thanks.

## 2022-08-25 ENCOUNTER — Telehealth: Payer: Self-pay | Admitting: Acute Care

## 2022-08-25 DIAGNOSIS — Z87891 Personal history of nicotine dependence: Secondary | ICD-10-CM

## 2022-08-25 DIAGNOSIS — F17210 Nicotine dependence, cigarettes, uncomplicated: Secondary | ICD-10-CM

## 2022-08-25 DIAGNOSIS — Z122 Encounter for screening for malignant neoplasm of respiratory organs: Secondary | ICD-10-CM

## 2022-08-25 NOTE — Telephone Encounter (Signed)
Order placed for 6 month follow up scan. Results sent to PCP with note of plan. Nothing further needed at this time.

## 2022-08-25 NOTE — Telephone Encounter (Signed)
I have called the patient with the patient with the results of her low dose Ct Chest. I explained that her scan was red as a LR 3, 6 month follow up. There are some new nodules that are ? Infectious / inflammatory. She states she was sick with a cough when she had the scan done.  Plan is for 6 months follow up 01/2023. She is in agreement with the plan.  Ladies, please fax  PCP and let them know plan is for a 6 month follow up 01/2023. I did speak with her about the new notation of fatty liver.She has an appointment with her PCP 09/05/2022, and I have asked her to discuss this with him. She verbalized understanding.

## 2023-01-25 ENCOUNTER — Ambulatory Visit
Admission: RE | Admit: 2023-01-25 | Discharge: 2023-01-25 | Disposition: A | Discharge status: Home / Self Care | Payer: BLUE CROSS/BLUE SHIELD | Source: Ambulatory Visit | Attending: Acute Care | Admitting: Acute Care

## 2023-01-25 DIAGNOSIS — Z122 Encounter for screening for malignant neoplasm of respiratory organs: Secondary | ICD-10-CM | POA: Insufficient documentation

## 2023-01-25 DIAGNOSIS — Z87891 Personal history of nicotine dependence: Secondary | ICD-10-CM | POA: Insufficient documentation

## 2023-01-25 DIAGNOSIS — F17210 Nicotine dependence, cigarettes, uncomplicated: Secondary | ICD-10-CM | POA: Insufficient documentation

## 2023-02-08 ENCOUNTER — Telehealth: Payer: Self-pay | Admitting: *Deleted

## 2023-02-08 NOTE — Telephone Encounter (Signed)
Pt called in requesting lung screening CT results. Returned call to patient can explained that we have nor received the results yet but will reach out to pt as soon as they are received. Pt verbalized understanding.

## 2023-02-21 ENCOUNTER — Other Ambulatory Visit: Payer: Self-pay

## 2023-02-21 DIAGNOSIS — Z87891 Personal history of nicotine dependence: Secondary | ICD-10-CM

## 2023-02-21 DIAGNOSIS — F17210 Nicotine dependence, cigarettes, uncomplicated: Secondary | ICD-10-CM

## 2023-02-21 DIAGNOSIS — Z122 Encounter for screening for malignant neoplasm of respiratory organs: Secondary | ICD-10-CM

## 2023-03-05 ENCOUNTER — Telehealth: Payer: Self-pay | Admitting: Acute Care

## 2023-03-05 NOTE — Telephone Encounter (Signed)
Returned call to patient from VM.  Inquiring into results and patient asked 'why we are placing her back on annual LDCT.'  Reviewed results over past few LDCT, as patient states she felt she was being scanned every 3 or 6 months due to multiple lung nodules and fatty liver.  Reviewed last few scans with patient to follow nodule history and radiology recommendations.  Based upon the last LDCT in October 2024, there were no further concerns noted to follow nodules, as the previous scan in April 2024 was a RADS3 but the nodules are now stable or resolved completely.  There is no further recommendation to repeat the LDCT sooner than one year.  Patient states she has a strong family history of a father with lung cancer, lung cancer also on her mother's side of the family.  She states she is 'not comfortable with waiting a year' to repeat the LDCT.  Considerations would be if the provider feels a sooner repeat is necessary and if there is sufficient reason for insurance to cover an additional CT scan. Advised we can ask the ordering provider to review the results again and advise, based upon results and patient's concerns, if a sooner CT scan is recommended.  Will advise patient once provider has had time to review.  Patient acknowledged understanding.    Also discussed with the patient why the scans are not paid in full, as she thought they would be.  Explained annual scan is different from follow up (more diagnostic) scan.   Sarah please review and advise.  Patient states she assumed she would get repeat scans every year since she had so many follow up scans and she is very concerned about waiting a year.

## 2023-03-07 ENCOUNTER — Telehealth: Payer: Self-pay

## 2023-03-07 NOTE — Telephone Encounter (Signed)
Called patient and LVM. Advised all nodules are smaller on this scan and a 12 month follow up in suggested. Advised a 6 month scan [place] not be covered by insurance. Requested patient call back and advise if she would like to move forward with a 6 month scan. Current reminder set for 12 month.

## 2023-03-07 NOTE — Telephone Encounter (Signed)
Patient returned call. Advises she will complete next scan at 12 months.

## 2023-07-25 ENCOUNTER — Other Ambulatory Visit: Payer: Self-pay | Admitting: Orthopedic Surgery

## 2023-08-02 ENCOUNTER — Other Ambulatory Visit

## 2023-08-09 ENCOUNTER — Ambulatory Visit: Admit: 2023-08-09 | Admitting: Orthopedic Surgery

## 2023-08-09 SURGERY — ARTHROPLASTY, HIP, TOTAL, ANTERIOR APPROACH
Anesthesia: Choice | Site: Hip | Laterality: Left

## 2023-08-14 IMAGING — MG MM DIGITAL SCREENING BILAT W/ TOMO AND CAD
8 series · 8 of 24 positions shown · non-contrast
Comparison: Previous exam(s).

CLINICAL DATA: Screening.

EXAM:
DIGITAL SCREENING BILATERAL MAMMOGRAM WITH TOMOSYNTHESIS AND CAD
TECHNIQUE: Bilateral screening digital craniocaudal and mediolateral oblique
mammograms were obtained. Bilateral screening digital breast
tomosynthesis was performed. The images were evaluated with
computer-aided detection.

[L MLO synth-2D]
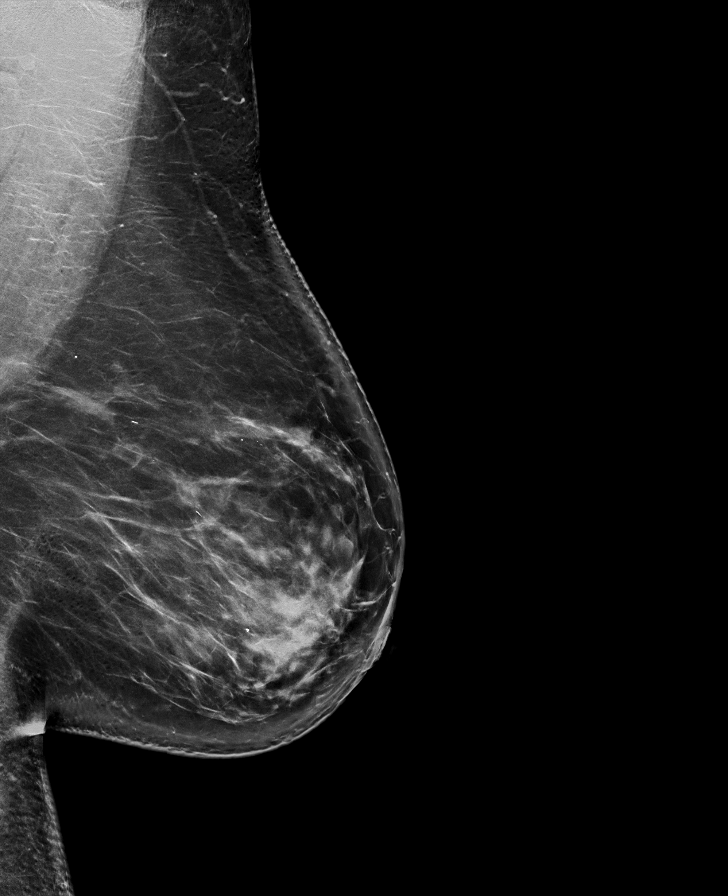

[R CC synth-2D]
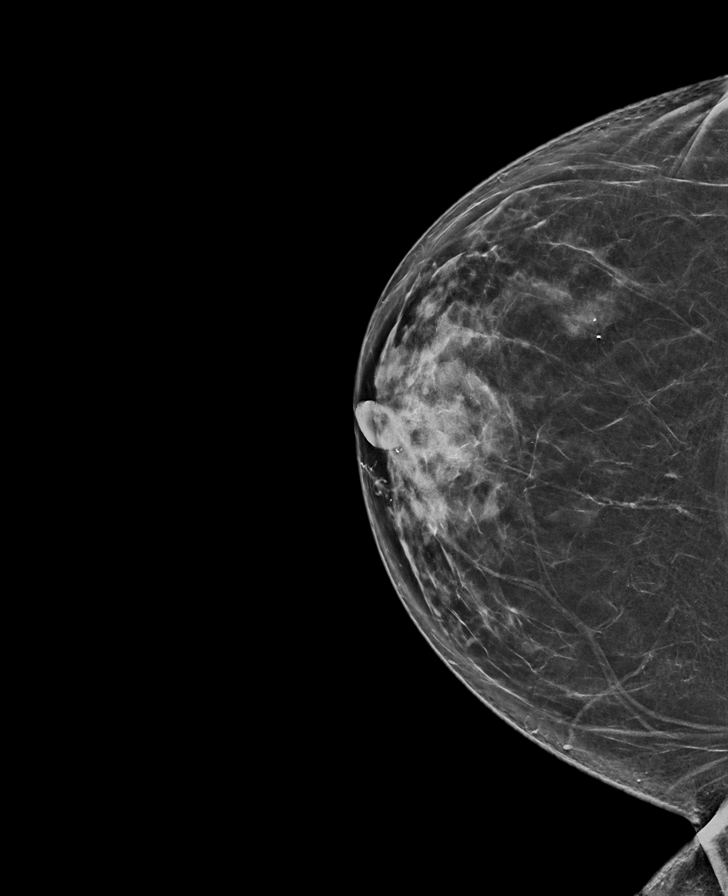

[L CC synth-2D]
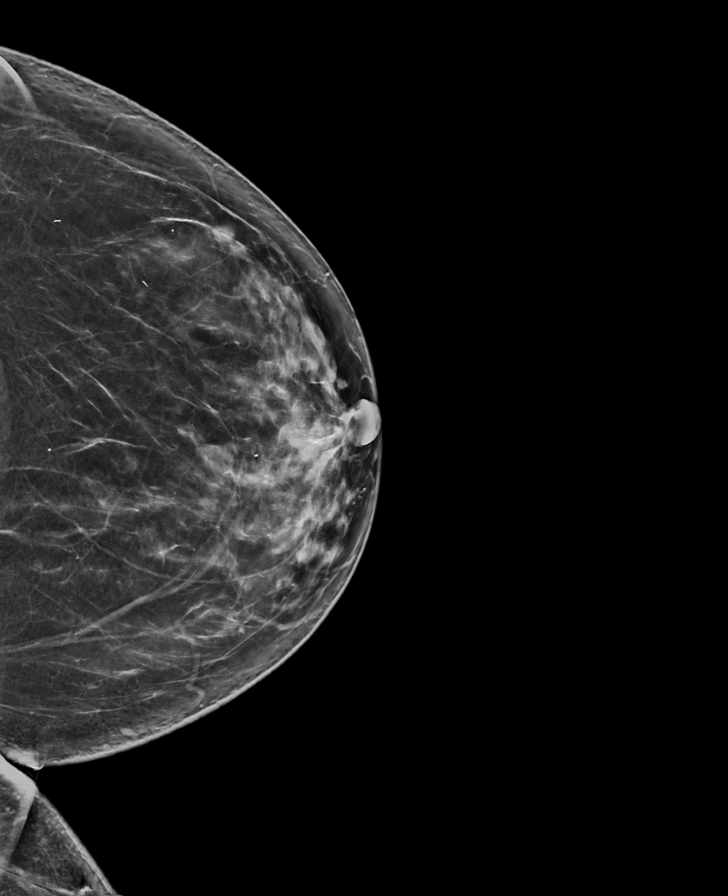

[R MLO synth-2D]
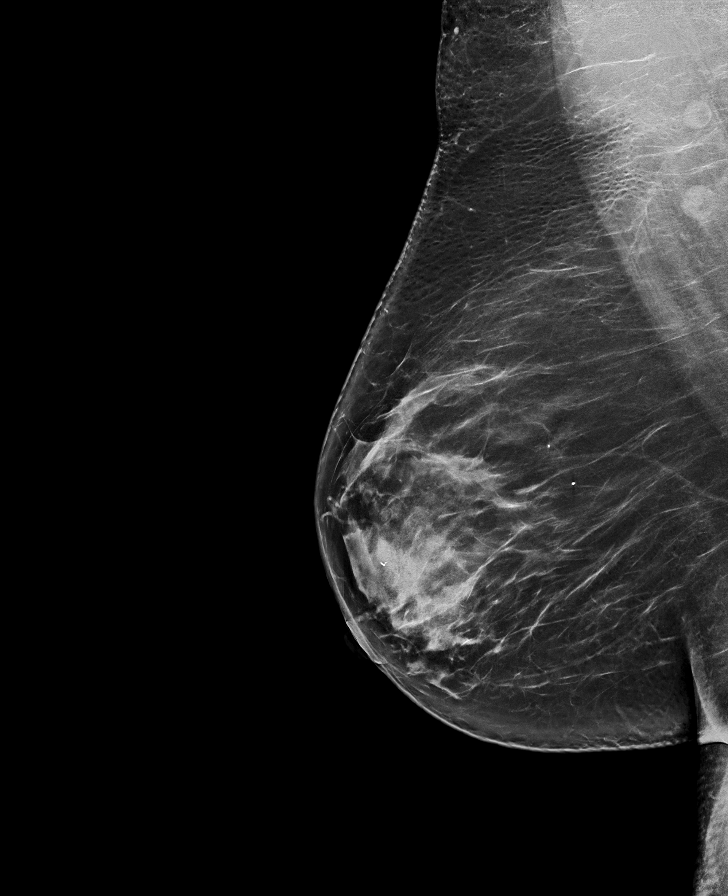

[L CC tomo · tomo slice 41/81.0]
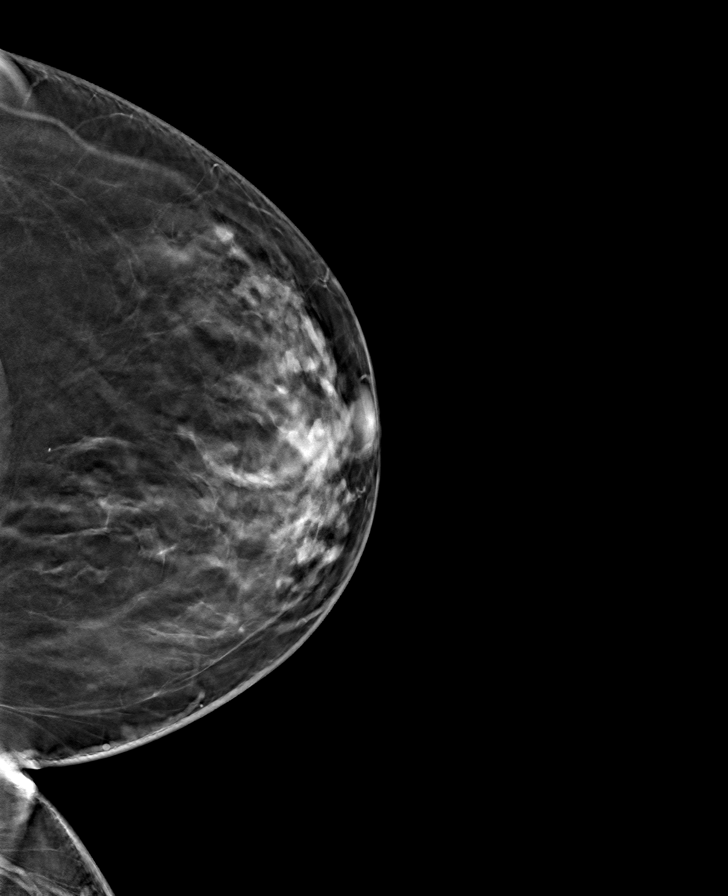

[R MLO tomo · tomo slice 47/92.0]
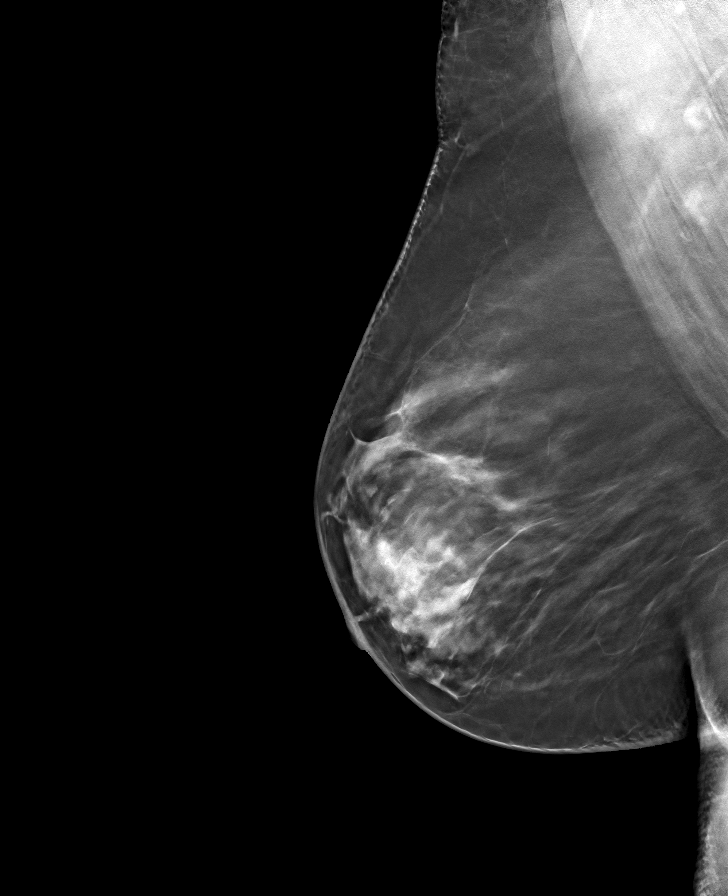

[R CC tomo · tomo slice 41/80.0]
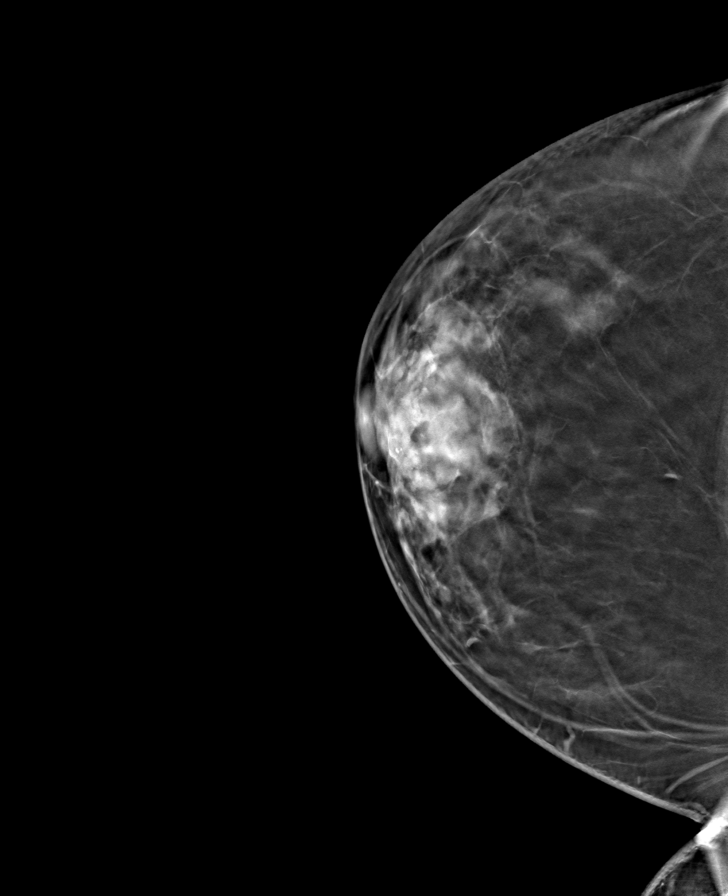

[L MLO tomo · tomo slice 49/97.0]
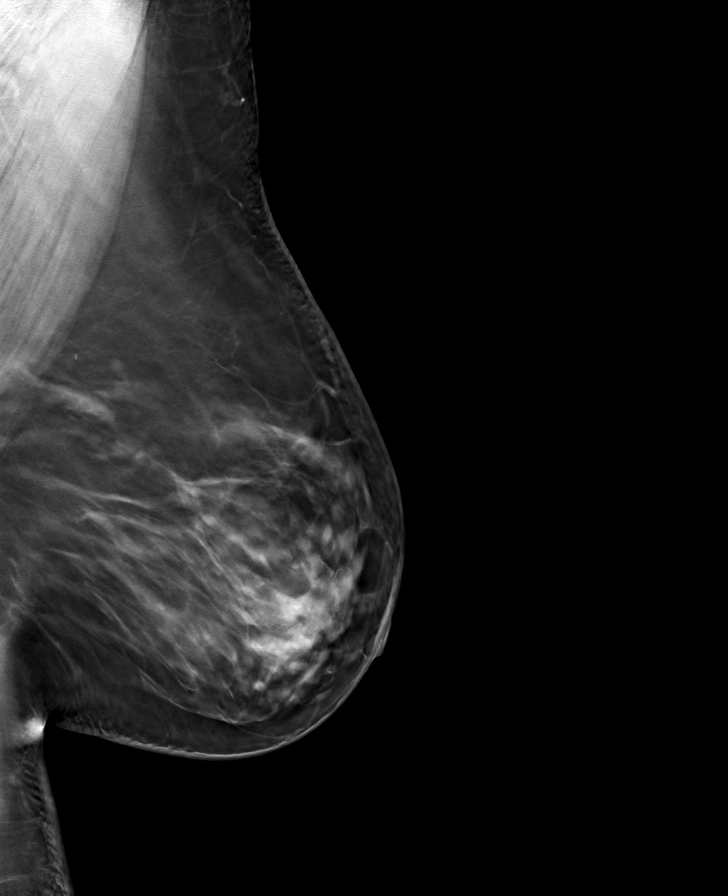

[8 of 24 positions shown; findings below may reference images not displayed]

ACR Breast Density Category c: The breast tissue is heterogeneously
dense, which may obscure small masses.
FINDINGS: There are no findings suspicious for malignancy.
IMPRESSION: No mammographic evidence of malignancy. A result letter of this
screening mammogram will be mailed directly to the patient.

RECOMMENDATION:
Screening mammogram in one year. (Code:Q3-W-BC3)

BI-RADS CATEGORY  1: Negative.

## 2024-05-06 ENCOUNTER — Other Ambulatory Visit: Payer: Self-pay | Admitting: Orthopedic Surgery

## 2024-06-09 ENCOUNTER — Ambulatory Visit: Admit: 2024-06-09 | Admitting: Orthopedic Surgery
